# Patient Record
Sex: Female | Born: 1985 | Race: White | Hispanic: No | Marital: Single | Smoking: Former smoker
Health system: Southern US, Community
[De-identification: ages and names within clinical notes are randomized; demographics above are authoritative.]

## PROBLEM LIST (undated history)

## (undated) DIAGNOSIS — A599 Trichomoniasis, unspecified: Secondary | ICD-10-CM

## (undated) DIAGNOSIS — J45909 Unspecified asthma, uncomplicated: Secondary | ICD-10-CM

## (undated) DIAGNOSIS — R87629 Unspecified abnormal cytological findings in specimens from vagina: Secondary | ICD-10-CM

## (undated) DIAGNOSIS — F99 Mental disorder, not otherwise specified: Secondary | ICD-10-CM

## (undated) HISTORY — DX: Trichomoniasis, unspecified: A59.9

## (undated) HISTORY — DX: Mental disorder, not otherwise specified: F99

## (undated) HISTORY — PX: OOPHORECTOMY: SHX86

## (undated) HISTORY — DX: Unspecified abnormal cytological findings in specimens from vagina: R87.629

---

## 2011-11-20 HISTORY — PX: COLPOSCOPY: SHX161

## 2011-12-03 ENCOUNTER — Other Ambulatory Visit (HOSPITAL_COMMUNITY)
Admission: RE | Admit: 2011-12-03 | Discharge: 2011-12-03 | Disposition: A | Discharge status: Home / Self Care | Payer: Self-pay | Source: Ambulatory Visit | Attending: Unknown Physician Specialty | Admitting: Unknown Physician Specialty

## 2011-12-03 ENCOUNTER — Other Ambulatory Visit: Payer: Self-pay | Admitting: Nurse Practitioner

## 2011-12-03 DIAGNOSIS — R87612 Low grade squamous intraepithelial lesion on cytologic smear of cervix (LGSIL): Secondary | ICD-10-CM | POA: Insufficient documentation

## 2015-09-03 ENCOUNTER — Emergency Department (HOSPITAL_COMMUNITY)
Admission: EM | Admit: 2015-09-03 | Discharge: 2015-09-03 | Disposition: A | Discharge status: Home / Self Care | Payer: Self-pay | Attending: Emergency Medicine | Admitting: Emergency Medicine

## 2015-09-03 ENCOUNTER — Encounter (HOSPITAL_COMMUNITY): Payer: Self-pay | Admitting: Emergency Medicine

## 2015-09-03 ENCOUNTER — Emergency Department (HOSPITAL_COMMUNITY): Payer: Self-pay

## 2015-09-03 DIAGNOSIS — Z3202 Encounter for pregnancy test, result negative: Secondary | ICD-10-CM | POA: Insufficient documentation

## 2015-09-03 DIAGNOSIS — R102 Pelvic and perineal pain: Secondary | ICD-10-CM

## 2015-09-03 DIAGNOSIS — Z792 Long term (current) use of antibiotics: Secondary | ICD-10-CM | POA: Insufficient documentation

## 2015-09-03 DIAGNOSIS — F17210 Nicotine dependence, cigarettes, uncomplicated: Secondary | ICD-10-CM | POA: Insufficient documentation

## 2015-09-03 DIAGNOSIS — N898 Other specified noninflammatory disorders of vagina: Secondary | ICD-10-CM | POA: Insufficient documentation

## 2015-09-03 DIAGNOSIS — Z791 Long term (current) use of non-steroidal anti-inflammatories (NSAID): Secondary | ICD-10-CM | POA: Insufficient documentation

## 2015-09-03 DIAGNOSIS — N390 Urinary tract infection, site not specified: Secondary | ICD-10-CM | POA: Insufficient documentation

## 2015-09-03 LAB — URINALYSIS, ROUTINE W REFLEX MICROSCOPIC
BILIRUBIN URINE: NEGATIVE
GLUCOSE, UA: NEGATIVE mg/dL
KETONES UR: NEGATIVE mg/dL
LEUKOCYTES UA: NEGATIVE
Nitrite: NEGATIVE
PH: 6 (ref 5.0–8.0)
Protein, ur: NEGATIVE mg/dL
SPECIFIC GRAVITY, URINE: 1.01 (ref 1.005–1.030)

## 2015-09-03 LAB — URINE MICROSCOPIC-ADD ON: BACTERIA UA: NONE SEEN

## 2015-09-03 LAB — PREGNANCY, URINE: Preg Test, Ur: NEGATIVE

## 2015-09-03 MED ORDER — CEPHALEXIN 500 MG PO CAPS
500.0000 mg | ORAL_CAPSULE | Freq: Once | ORAL | Status: AC
  Administered 2015-09-03: 500 mg via ORAL
  Filled 2015-09-03: qty 1

## 2015-09-03 MED ORDER — CEPHALEXIN 250 MG PO CAPS
250.0000 mg | ORAL_CAPSULE | Freq: Four times a day (QID) | ORAL | Status: DC
Start: 2015-09-03 — End: 2016-12-31

## 2015-09-03 MED ORDER — IBUPROFEN 800 MG PO TABS
800.0000 mg | ORAL_TABLET | Freq: Once | ORAL | Status: AC
  Administered 2015-09-03: 800 mg via ORAL
  Filled 2015-09-03: qty 1

## 2015-09-03 NOTE — ED Provider Notes (Signed)
CSN: [id number]     Arrival date & time 09/03/15  1146 History   First MD Initiated Contact with Patient 09/03/15 1254     Chief Complaint  Patient presents with  . Abdominal Pain     (Consider location/radiation/quality/duration/timing/severity/associated sxs/prior Treatment) HPI  This is a 30 year old female who comes in today complaining of lower abdominal pain in the suprapubic to just left of center for at least a week. She states that she was seen at the health department on Tuesday. At that time she had a pelvic exam and says that she was given antibiotics for bacterial vaginosis. She has continued to have some vaginal discharge. She has normal menstrual cycles but this cycle has been a few days late. She states that she began having bleeding consistent with her menstrual cycle on the way here. She has a history of a left oophorectomy secondary to a very large cyst that she states twisted on itself. She describes pain as sharp and stabbing intermittently worse but not completely resolving. She denies fever, chills, nausea, vomiting or diarrhea. He is sexually active with a female partner. She denies any heterosexual contact. She does complain of some vaginal discharge. She denies any history of sexually transmitted infections.  History reviewed. No pertinent past medical history. Past Surgical History  Procedure Laterality Date  . Oophorectomy     History reviewed. No pertinent family history. Social History  Substance Use Topics  . Smoking status: Current Every Day Smoker -- 1.00 packs/day for 5 years    Types: Cigarettes  . Smokeless tobacco: Never Used  . Alcohol Use: No   OB History    No data available     Review of Systems  All other systems reviewed and are negative.     Allergies  Sulfa antibiotics  Home Medications   Prior to Admission medications   Medication Sig Start Date End Date Taking? Authorizing Provider  metroNIDAZOLE (FLAGYL) 500 MG tablet Take 500  mg by mouth 2 (two) times daily. Starting 08/29/2015 x 7 days.   Yes Historical Provider, MD  naproxen sodium (ALEVE) 220 MG tablet Take 220 mg by mouth 2 (two) times daily with a meal.   Yes Historical Provider, MD   BP 115/70 mmHg  Pulse 67  Temp(Src) 98.1 F (36.7 C) (Oral)  Resp 16  Ht 5\' 4"  (1.626 m)  Wt 63.504 kg  BMI 24.02 kg/m2  SpO2 100%  LMP 08/03/2015 Physical Exam  Constitutional: She is oriented to person, place, and time. She appears well-developed and well-nourished. No distress.  HENT:  Head: Normocephalic and atraumatic.  Right Ear: External ear normal.  Left Ear: External ear normal.  Nose: Nose normal.  Mouth/Throat: Oropharynx is clear and moist.  Eyes: Conjunctivae and EOM are normal. Pupils are equal, round, and reactive to light.  Neck: Normal range of motion. Neck supple.  Cardiovascular: Normal rate and regular rhythm.   Pulmonary/Chest: Effort normal and breath sounds normal.  Abdominal: Soft. Bowel sounds are normal. There is tenderness.  Mild suprapubic tenderness to palpation  Genitourinary: Vagina normal.  Vaginal discharge consistent with menstrual cycle. Cervical motion tenderness with some bulging tenderness  in the posterior fornix no lateral masses  Musculoskeletal: She exhibits no edema.  Neurological: She is alert and oriented to person, place, and time.  Skin: Skin is warm and dry.  Psychiatric: She has a normal mood and affect. Her behavior is normal.  Nursing note and vitals reviewed.   ED Course  Procedures (including  critical care time) Labs Review Labs Reviewed  URINALYSIS, ROUTINE W REFLEX MICROSCOPIC (NOT AT [facility]) - Abnormal; Notable for the following:    Hgb urine dipstick MODERATE (*)    All other components within normal limits  URINE MICROSCOPIC-ADD ON - Abnormal; Notable for the following:    Squamous Epithelial / LPF 6-30 (*)    All other components within normal limits  PREGNANCY, URINE  GC/CHLAMYDIA PROBE AMP ([facility]) NOT AT [facility]    Imaging Review US Transvaginal Non-ob  09/03/2015  CLINICAL DATA:  Mid left pelvic pain, prior left oophorectomy for torsion in 2010 EXAM: TRANSABDOMINAL AND TRANSVAGINAL ULTRASOUND OF PELVIS DOPPLER ULTRASOUND OF OVARIES TECHNIQUE: Both transabdominal and transvaginal ultrasound examinations of the pelvis were performed. Transabdominal technique was performed for global imaging of the pelvis including uterus, ovaries, adnexal regions, and pelvic cul-de-sac. It was necessary to proceed with endovaginal exam following the transabdominal exam to visualize the endometrium. Color and duplex Doppler ultrasound was utilized to evaluate blood flow to the ovaries. COMPARISON:  None. FINDINGS: Uterus Measurements: 7.2 x 2.3 x 4.7 cm. No fibroids or other mass visualized. Endometrium Thickness: 10 mm.  No focal abnormality visualized. Right ovary Measurements: 3.7 x 1.9 x 3.3 cm. Normal appearance/no adnexal mass. Left ovary Surgically absent. Pulsed Doppler evaluation of the right ovary demonstrates normal low-resistance arterial and venous waveforms. Other findings No abnormal free fluid. IMPRESSION: Status post left oophorectomy. No evidence of right ovarian torsion. Electronically Signed   By: Charline Bills M.D.   On: 09/03/2015 16:18   US Pelvis Complete  09/03/2015  CLINICAL DATA:  Mid left pelvic pain, prior left oophorectomy for torsion in 2010 EXAM: TRANSABDOMINAL AND TRANSVAGINAL ULTRASOUND OF PELVIS DOPPLER ULTRASOUND OF OVARIES TECHNIQUE: Both transabdominal and transvaginal ultrasound examinations of the pelvis were performed. Transabdominal technique was performed for global imaging of the pelvis including uterus, ovaries, adnexal regions, and pelvic cul-de-sac. It was necessary to proceed with endovaginal exam following the transabdominal exam to visualize the endometrium. Color and duplex Doppler ultrasound was utilized to evaluate blood flow to the ovaries. COMPARISON:   None. FINDINGS: Uterus Measurements: 7.2 x 2.3 x 4.7 cm. No fibroids or other mass visualized. Endometrium Thickness: 10 mm.  No focal abnormality visualized. Right ovary Measurements: 3.7 x 1.9 x 3.3 cm. Normal appearance/no adnexal mass. Left ovary Surgically absent. Pulsed Doppler evaluation of the right ovary demonstrates normal low-resistance arterial and venous waveforms. Other findings No abnormal free fluid. IMPRESSION: Status post left oophorectomy. No evidence of right ovarian torsion. Electronically Signed   By: Charline Bills M.D.   On: 09/03/2015 16:18   [place] Art/ven Flow Abd Pelv Doppler  09/03/2015  CLINICAL DATA:  Mid left pelvic pain, prior left oophorectomy for torsion in 2010 EXAM: TRANSABDOMINAL AND TRANSVAGINAL ULTRASOUND OF PELVIS DOPPLER ULTRASOUND OF OVARIES TECHNIQUE: Both transabdominal and transvaginal ultrasound examinations of the pelvis were performed. Transabdominal technique was performed for global imaging of the pelvis including uterus, ovaries, adnexal regions, and pelvic cul-de-sac. It was necessary to proceed with endovaginal exam following the transabdominal exam to visualize the endometrium. Color and duplex Doppler ultrasound was utilized to evaluate blood flow to the ovaries. COMPARISON:  None. FINDINGS: Uterus Measurements: 7.2 x 2.3 x 4.7 cm. No fibroids or other mass visualized. Endometrium Thickness: 10 mm.  No focal abnormality visualized. Right ovary Measurements: 3.7 x 1.9 x 3.3 cm. Normal appearance/no adnexal mass. Left ovary Surgically absent. Pulsed Doppler evaluation of the right ovary demonstrates normal low-resistance arterial and  venous waveforms. Other findings No abnormal free fluid. IMPRESSION: Status post left oophorectomy. No evidence of right ovarian torsion. Electronically Signed   By: Charline Bills M.D.   On: 09/03/2015 16:18   I have personally reviewed and evaluated these images and lab results as part of my medical decision-making.   EKG  Interpretation None      MDM   Final diagnoses:  Pelvic pain in female   30 year old female who reports no prior pregnancies comes in today with pelvic pain and some vaginal discharge. She reports that she'll contact only with female partners. This decreases her facility of some sexually transmitted infections. She has recently been treated for bacterial vaginosis and no repeat prep was done. Patient had cultures sent for gonorrhea and chlamydia. She does have tenderness to palpation and is having an ultrasound done given her history of ovarian torsion with a left oophorectomy.    Margarita Grizzle, MD 09/05/15 [id number]

## 2015-09-03 NOTE — ED Notes (Addendum)
Patient c/o left lower abd pain. Per patient pain "for a while and has went to health department but they are unable to do ultrasound." Patient states she has had pain in left lower abd since having left ovary removed in 2013. Per patient period is a few days late, denies any possibility of pregnancy. Denies any nausea, vomiting, or diarrhea. Patient reports sharp pain in lower abd becoming more severe this morning. Patient taking antibiotic for BV.

## 2015-09-03 NOTE — Discharge Instructions (Signed)

## 2015-09-04 LAB — GC/CHLAMYDIA PROBE AMP (~~LOC~~) NOT AT ARMC
CHLAMYDIA, DNA PROBE: NEGATIVE
Neisseria Gonorrhea: NEGATIVE

## 2016-04-12 ENCOUNTER — Emergency Department (HOSPITAL_COMMUNITY): Payer: Self-pay

## 2016-04-12 ENCOUNTER — Emergency Department (HOSPITAL_COMMUNITY)
Admission: EM | Admit: 2016-04-12 | Discharge: 2016-04-12 | Disposition: A | Discharge status: Home / Self Care | Payer: Self-pay | Attending: Emergency Medicine | Admitting: Emergency Medicine

## 2016-04-12 ENCOUNTER — Other Ambulatory Visit: Payer: Self-pay

## 2016-04-12 ENCOUNTER — Encounter (HOSPITAL_COMMUNITY): Payer: Self-pay | Admitting: *Deleted

## 2016-04-12 DIAGNOSIS — Z79899 Other long term (current) drug therapy: Secondary | ICD-10-CM | POA: Insufficient documentation

## 2016-04-12 DIAGNOSIS — F17210 Nicotine dependence, cigarettes, uncomplicated: Secondary | ICD-10-CM | POA: Insufficient documentation

## 2016-04-12 DIAGNOSIS — J208 Acute bronchitis due to other specified organisms: Secondary | ICD-10-CM | POA: Insufficient documentation

## 2016-04-12 DIAGNOSIS — J45909 Unspecified asthma, uncomplicated: Secondary | ICD-10-CM | POA: Insufficient documentation

## 2016-04-12 HISTORY — DX: Unspecified asthma, uncomplicated: J45.909

## 2016-04-12 LAB — COMPREHENSIVE METABOLIC PANEL
ALBUMIN: 4.3 g/dL (ref 3.5–5.0)
ALK PHOS: 51 U/L (ref 38–126)
ALT: 21 U/L (ref 14–54)
AST: 20 U/L (ref 15–41)
Anion gap: 5 (ref 5–15)
BILIRUBIN TOTAL: 0.6 mg/dL (ref 0.3–1.2)
BUN: 14 mg/dL (ref 6–20)
CALCIUM: 9 mg/dL (ref 8.9–10.3)
CO2: 28 mmol/L (ref 22–32)
CREATININE: 0.73 mg/dL (ref 0.44–1.00)
Chloride: 105 mmol/L (ref 101–111)
Glucose, Bld: 88 mg/dL (ref 65–99)
Potassium: 4.4 mmol/L (ref 3.5–5.1)
SODIUM: 138 mmol/L (ref 135–145)
TOTAL PROTEIN: 7.2 g/dL (ref 6.5–8.1)

## 2016-04-12 LAB — POC URINE PREG, ED: PREG TEST UR: NEGATIVE

## 2016-04-12 LAB — LIPASE, BLOOD: Lipase: 22 U/L (ref 11–51)

## 2016-04-12 MED ORDER — BENZONATATE 100 MG PO CAPS: 100.0000 mg | ORAL_CAPSULE | Freq: Three times a day (TID) | ORAL | 0 refills | Status: DC | PRN

## 2016-04-12 MED ORDER — LORATADINE-PSEUDOEPHEDRINE ER 5-120 MG PO TB12: 1.0000 | ORAL_TABLET | Freq: Two times a day (BID) | ORAL | 0 refills | Status: DC

## 2016-04-12 MED ORDER — PREDNISONE 20 MG PO TABS
40.0000 mg | ORAL_TABLET | Freq: Every day | ORAL | 0 refills | Status: DC
Start: 2016-04-12 — End: 2016-12-31

## 2016-04-12 MED ORDER — DEXAMETHASONE 4 MG PO TABS: 4.0000 mg | ORAL_TABLET | Freq: Two times a day (BID) | ORAL | 0 refills | Status: DC

## 2016-04-12 MED ORDER — PROMETHAZINE-CODEINE 6.25-10 MG/5ML PO SYRP: ORAL_SOLUTION | ORAL | 0 refills | Status: DC

## 2016-04-12 NOTE — Discharge Instructions (Signed)
Please increase fluids. Please use Tylenol every 4 hours or ibuprofen every 6 hours for fever or aching. Please wash hands frequently. Use Decadron 2 times daily with food. Use promethazine cough medication every 4 hours as needed for cough and congestion. This medication may cause drowsiness, please use with caution. Please see your primary physician or return to the emergency department if not improving.

## 2016-04-12 NOTE — ED Provider Notes (Signed)
[facility]EMER[facility]Y DEPT Provider Note   CSN: [id number] Arrival date & time: 04/12/16  1508     History   Chief Complaint Chief Complaint  Patient presents with  . Cough    HPI Jacqueline Forbes is a 30 y.o. female.  Patient is a 30 year old female who presents to the emergency department with a complaint of cough and congestion.  The patient states she's had 3 days of cough and congestion. She presents now because she is now having pressure sensation in her chest that seems to come and go. This actually worsens with cough and movement. She states however there are times when it seems to worsen with certain foods. She is concerned because she has a strong family history of gallbladder disease. She has not had any fever or chills. There's been no hemoptysis reported. There's been no injury to the chest. There's been no recent operations or procedures. The patient denies any recent long trips, or long bouts of inactivity. She's never been diagnosed with deep vein thrombosis or pulmonary embolus in the past. Coughing seems to make the problem worse. Nothing seems to really make it any better.      Past Medical History:  Diagnosis Date  . Asthma     There are no active problems to display for this patient.   Past Surgical History:  Procedure Laterality Date  . OOPHORECTOMY      OB History    No data available       Home Medications    Prior to Admission medications   Medication Sig Start Date End Date Taking? Authorizing Provider  cephALEXin (KEFLEX) 250 MG capsule Take 1 capsule (250 mg total) by mouth 4 (four) times daily. 09/03/15   Margarita Grizzleanielle Ray, MD  metroNIDAZOLE (FLAGYL) 500 MG tablet Take 500 mg by mouth 2 (two) times daily. Starting 08/29/2015 x 7 days.    Historical Provider, MD  naproxen sodium (ALEVE) 220 MG tablet Take 220 mg by mouth 2 (two) times daily with a meal.    Historical Provider, MD    Family History No family history on file.  Social  History Social History  Substance Use Topics  . Smoking status: Current Every Day Smoker    Packs/day: 1.00    Years: 5.00    Types: Cigarettes  . Smokeless tobacco: Never Used  . Alcohol use No     Allergies   Sulfa antibiotics   Review of Systems Review of Systems  Constitutional: Positive for appetite change and chills. Negative for fever.  Respiratory: Positive for cough and chest tightness.   Cardiovascular: Negative for leg swelling.  All other systems reviewed and are negative.    Physical Exam Updated Vital Signs BP 114/64 (BP Location: Left Arm)   Pulse 72   Temp 98.1 F (36.7 C) (Oral)   Resp 16   Ht 5\' 4"  (1.626 m)   Wt 63.5 kg   LMP 04/07/2016   SpO2 100%   BMI 24.03 kg/m   Physical Exam  Constitutional: She is oriented to person, place, and time. She appears well-developed and well-nourished.  Non-toxic appearance.  HENT:  Head: Normocephalic.  Right Ear: Tympanic membrane and external ear normal.  Left Ear: Tympanic membrane and external ear normal.  Eyes: EOM and lids are normal. Pupils are equal, round, and reactive to light.  Neck: Normal range of motion. Neck supple. Carotid bruit is not present.  Cardiovascular: Normal rate, regular rhythm, normal heart sounds, intact distal pulses and normal pulses.  Pulmonary/Chest: Breath sounds normal. No respiratory distress. She has no wheezes.  Course breath sounds. There is symmetrical rise and fall of the chest. Patient speaks in complete sentences without problem.  Abdominal: Soft. Bowel sounds are normal. She exhibits no distension and no mass. There is no tenderness. There is no guarding.  Musculoskeletal: Normal range of motion.  Negative Homans sign. No swelling or redness of the lower extremities.  Lymphadenopathy:       Head (right side): No submandibular adenopathy present.       Head (left side): No submandibular adenopathy present.    She has no cervical adenopathy.  Neurological: She is  alert and oriented to person, place, and time. She has normal strength. No cranial nerve deficit or sensory deficit.  Skin: Skin is warm and dry.  Psychiatric: She has a normal mood and affect. Her speech is normal.  Nursing note and vitals reviewed.    ED Treatments / Results  Labs (all labs ordered are listed, but only abnormal results are displayed) Labs Reviewed  LIPASE, BLOOD  COMPREHENSIVE METABOLIC PANEL  POC URINE PREG, ED    EKG  EKG Interpretation None       Radiology Dg Chest 2 View  Result Date: 04/12/2016 CLINICAL DATA:  Central chest pain and productive cough for 2-3 days. EXAM: CHEST  2 VIEW COMPARISON:  None. FINDINGS: The heart size and mediastinal contours are within normal limits. Both lungs are clear. The visualized skeletal structures are unremarkable. IMPRESSION: Normal chest x-ray Electronically Signed   By: Rudie Meyer M.D.   On: 04/12/2016 16:47    Procedures Procedures (including critical care time)  Medications Ordered in ED Medications - No data to display   Initial Impression / Assessment and Plan / ED Course  I have reviewed the triage vital signs and the nursing notes.  Pertinent labs & imaging results that were available during my care of the patient were reviewed by me and considered in my medical decision making (see chart for details).  Clinical Course    **I have reviewed nursing notes, vital signs, and all appropriate lab and imaging results for this patient.*  Final Clinical Impressions(s) / ED Diagnoses  Pulse oximetry is 100% on room air. Within normal limits by my interpretation. Chest x-ray is negative for acute findings.  Suspect bronchitis. Patient will be treated with Decadron and promethazine codeine cough medication. I've asked the patient increase fluids, and to wash hands frequently. She is to return to the emergency department if any unusual changes, problems, or concerns.    Final diagnoses:  None    New  Prescriptions New Prescriptions   No medications on file     Ivery Quale, PA-C 04/12/16 1754    Bethann Berkshire, MD 04/15/16 2005

## 2016-04-12 NOTE — ED Triage Notes (Signed)
Pt comes in with cough and congestion starting 3 days ago. Pt states she has a pressure chest that comes and goes, and that is worsens with cough and movement.

## 2016-12-31 ENCOUNTER — Encounter: Payer: Self-pay | Admitting: *Deleted

## 2017-01-07 ENCOUNTER — Encounter: Payer: Self-pay | Admitting: Obstetrics & Gynecology

## 2017-01-27 ENCOUNTER — Ambulatory Visit (INDEPENDENT_AMBULATORY_CARE_PROVIDER_SITE_OTHER): Payer: Self-pay | Admitting: Obstetrics & Gynecology

## 2017-01-27 ENCOUNTER — Encounter: Payer: Self-pay | Admitting: Obstetrics & Gynecology

## 2017-01-27 VITALS — BP 90/60 | HR 76 | Wt 155.0 lb

## 2017-01-27 DIAGNOSIS — N841 Polyp of cervix uteri: Secondary | ICD-10-CM

## 2017-01-27 DIAGNOSIS — N920 Excessive and frequent menstruation with regular cycle: Secondary | ICD-10-CM

## 2017-01-27 DIAGNOSIS — N944 Primary dysmenorrhea: Secondary | ICD-10-CM

## 2017-01-27 MED ORDER — NAPROXEN SODIUM 550 MG PO TABS: 550.0000 mg | ORAL_TABLET | Freq: Two times a day (BID) | ORAL | 1 refills | Status: DC

## 2017-01-27 NOTE — Progress Notes (Signed)
Chief Complaint  Patient presents with  . Referral    cervical polyp    Blood pressure 90/60, pulse 76, weight 155 lb (70.3 kg), last menstrual period 01/01/2017.  31 y.o. G0P0000 Patient's last menstrual period was 01/01/2017. The current method of family planning is none.  Outpatient Encounter Prescriptions as of 01/27/2017  Medication Sig  . loratadine (CLARITIN) 10 MG tablet Take 10 mg by mouth daily.  . naproxen sodium (ANAPROX DS) 550 MG tablet Take 1 tablet (550 mg total) by mouth 2 (two) times daily with a meal.  . [DISCONTINUED] clindamycin (CLEOCIN) 300 MG capsule Take 300 mg by mouth 2 (two) times daily.  . [DISCONTINUED] fluconazole (DIFLUCAN) 150 MG tablet Take 150 mg by mouth daily.   No facility-administered encounter medications on file as of 01/27/2017.     Subjective Patient is referral in today [facility] [place] [facility] for evaluation of a lesion of the cervix felt to be a cervical polyp She has had some postcoital bleeding As a history of a left salpingo-oophorectomy because of the large cyst She has fairly regular periods they are quite painful and heavy always been her baseline, today she can hardly move she is in the fetal position, she is on a birth control pills because she smokes and she does not have a stroke So she is trying to stop smoking which I definitely encourage She also states she's had some recurrent been treated at least for recurrent BV and yeast infections over the past couple years and I told hard for me to really comment on that unless I see her and actually documented, vaginitis she's having But I did refer her to using [employer] femdophilus and that should diminish her episode significantly Objective General WDWN female NAD Vulva:  normal appearing vulva with no masses, tenderness or lesions Vagina:  normal mucosa, no discharge Cervix:  Endocervical polyp is present and removed without difficulty with a ring  forcep, minimal bleeding after, it isn't the pathology for evaluation Uterus:  normal size, contour, position, consistency, mobility, non-tender Adnexa: ovaries:,     Pertinent ROS No burning with urination, frequency or urgency No nausea, vomiting or diarrhea Nor fever chills or other constitutional symptoms   Labs or studies none2    Impression Diagnoses this Encounter::   ICD-10-CM   1. Endocervical polyp N84.1    Removed without difficulty and the office today  2. Menorrhagia with regular cycle N92.0   3. Primary dysmenorrhea N94.4     Established relevant diagnosis(es):   Plan/Recommendations: Meds ordered this encounter  Medications  . naproxen sodium (ANAPROX DS) 550 MG tablet    Sig: Take 1 tablet (550 mg total) by mouth 2 (two) times daily with a meal.    Dispense:  60 tablet    Refill:  1    Labs or Scans Ordered: No orders of the defined types were placed in this encounter.   Management:: Endocervical polyp was removed the day She is encouraged to gets femdophilus probiotic for vaginal stability of the micro-environment She is given a prescription for Anaprox DS and encouraged using pads for dysmenorrhea but also consider getting on something for cycle control like an oral contraceptive pill  Follow up Return if symptoms worsen or fail to improve, for Follow up, with Dr Despina HiddenEure.        Face to face time:  20 minutes  Greater than 50% of the visit time was spent in counseling and  coordination of care with the patient.  The summary and outline of the counseling and care coordination is summarized in the note above.   All questions were answered.  Past Medical History:  Diagnosis Date  . Asthma   . Trichomonas infection   . Vaginal Pap smear, abnormal    2013 LSIL    Past Surgical History:  Procedure Laterality Date  . COLPOSCOPY  11/2011  . OOPHORECTOMY      OB History    Gravida Para Term Preterm AB Living   0 0 0 0 0 0   SAB TAB  Ectopic Multiple Live Births   0 0 0 0 0      Allergies  Allergen Reactions  . Metronidazole   . Septra [Sulfamethoxazole-Trimethoprim]   . Sulfa Antibiotics Hives and Nausea And Vomiting  . Tylenol With Codeine #3 [Acetaminophen-Codeine]     Social History   Social History  . Marital status: Single    Spouse name: N/A  . Number of children: N/A  . Years of education: N/A   Social History Main Topics  . Smoking status: Current Every Day Smoker    Packs/day: 0.50    Years: 5.00    Types: Cigarettes  . Smokeless tobacco: Never Used  . Alcohol use No  . Drug use: No  . Sexual activity: Yes   Other Topics Concern  . None   Social History Narrative  . None    Family History  Problem Relation Age of Onset  . Arthritis Father   . Depression Father   . Arthritis Mother   . Cancer Mother        breast

## 2017-01-27 NOTE — Addendum Note (Signed)
Addended by: Federico FlakeNES, Ambar Raphael A on: 01/27/2017 04:39 PM   Modules accepted: Orders

## 2017-01-30 ENCOUNTER — Telehealth: Payer: Self-pay | Admitting: Obstetrics & Gynecology

## 2017-01-30 NOTE — Telephone Encounter (Signed)
Patient had questions regarding bleeding after having polyp removed. Stated she has just had spotting until today but thinks she could be starting her period. Informed patient it could last for a week but to monitor. If she was saturating more than 1-2 pads an hour, then she needed to call us or go to the ER. Verbalized understanding.

## 2017-01-30 NOTE — Telephone Encounter (Signed)
Pt called stating that she would like to speak with Dr. Despina HiddenEure or his nurse. Pt did not specify the reason why. Please contact pt

## 2017-04-28 ENCOUNTER — Encounter: Payer: Self-pay | Admitting: Obstetrics & Gynecology

## 2017-05-13 ENCOUNTER — Encounter: Payer: Self-pay | Admitting: Obstetrics & Gynecology

## 2017-05-13 ENCOUNTER — Encounter (INDEPENDENT_AMBULATORY_CARE_PROVIDER_SITE_OTHER): Payer: Self-pay

## 2017-05-13 ENCOUNTER — Ambulatory Visit (INDEPENDENT_AMBULATORY_CARE_PROVIDER_SITE_OTHER): Payer: Self-pay | Admitting: Obstetrics & Gynecology

## 2017-05-13 VITALS — BP 100/56 | HR 79 | Ht 64.0 in | Wt 143.0 lb

## 2017-05-13 DIAGNOSIS — B9689 Other specified bacterial agents as the cause of diseases classified elsewhere: Secondary | ICD-10-CM

## 2017-05-13 DIAGNOSIS — Z1389 Encounter for screening for other disorder: Secondary | ICD-10-CM

## 2017-05-13 DIAGNOSIS — N760 Acute vaginitis: Secondary | ICD-10-CM

## 2017-05-13 LAB — POCT URINALYSIS DIPSTICK
Glucose, UA: NEGATIVE
KETONES UA: NEGATIVE
Leukocytes, UA: NEGATIVE
Nitrite, UA: NEGATIVE
PROTEIN UA: NEGATIVE
RBC UA: NEGATIVE

## 2017-05-13 MED ORDER — CLINDAMYCIN HCL 300 MG PO CAPS: 300.0000 mg | ORAL_CAPSULE | Freq: Two times a day (BID) | ORAL | 0 refills | Status: DC

## 2017-05-13 NOTE — Progress Notes (Signed)
Chief Complaint  Patient presents with  . Vaginal Discharge    Blood pressure (!) 100/56, pulse 79, height 5\' 4"  (1.626 m), weight 143 lb (64.9 kg).  31 y.o. G0P0000 No LMP recorded. The current method of family planning is none.  Subjective Vaginal discharge for 2weeks Itching yes Irritation yes/painful Odor yes Similar to previous yes Associated shooting pain Modifying factors: no antibiotics  Previous treatment never gel  Past Medical History:  Diagnosis Date  . Asthma   . Trichomonas infection   . Vaginal Pap smear, abnormal    2013 LSIL    Past Surgical History:  Procedure Laterality Date  . COLPOSCOPY  11/2011  . OOPHORECTOMY      OB History    Gravida Para Term Preterm AB Living   0 0 0 0 0 0   SAB TAB Ectopic Multiple Live Births   0 0 0 0 0      Allergies  Allergen Reactions  . Metronidazole   . Septra [Sulfamethoxazole-Trimethoprim]   . Sulfa Antibiotics Hives and Nausea And Vomiting  . Tylenol With Codeine #3 [Acetaminophen-Codeine]     Social History   Social History  . Marital status: Single    Spouse name: N/A  . Number of children: N/A  . Years of education: N/A   Social History Main Topics  . Smoking status: Current Every Day Smoker    Packs/day: 0.50    Years: 5.00    Types: Cigarettes  . Smokeless tobacco: Never Used  . Alcohol use No  . Drug use: No  . Sexual activity: Yes   Other Topics Concern  . None   Social History Narrative  . None    Family History  Problem Relation Age of Onset  . Arthritis Father   . Depression Father   . Arthritis Mother   . Cancer Mother        breast     Objective Vulva:  normal appearing vulva with no masses, tenderness or lesions Vagina:  normal mucosa, no  lesions, thick grey discharge Cervix:  no cervical motion tenderness, no lesions and no polyp Uterus:  normal size, contour, position, consistency, mobility, non-tender Adnexa: ovaries:present,  normal adnexa in  size, nontender and no masses     Pertinent ROS No burning with urination, frequency or urgency No nausea, vomiting or diarrhea Nor fever chills or other constitutional symptoms   Labs or studies Wet Prep:   A sample of vaginal discharge was obtained from the posterior fornix using a cotton swab. 2 drops of saline were placed on a slide and the cotton swab was immersed in the saline. Microscopic evaluation was performed and results were as follows:  Negative  for yeast  Positive for clue cells , consistent with Bacterial vaginosis Negative for trichomonas  Normal WBC population   Whiff test: Positive     Impression Diagnoses this Encounter::   ICD-10-CM   1. BV (bacterial vaginosis) N76.0    B96.89   2. Screening for genitourinary condition Z13.89 POCT Urinalysis Dipstick    Established relevant diagnosis(es):   Plan/Recommendations: Meds ordered this encounter  Medications  . clindamycin (CLEOCIN) 300 MG capsule    Sig: Take 1 capsule (300 mg total) by mouth 2 (two) times daily.    Dispense:  14 capsule    Refill:  0    Labs or Scans Ordered: Orders Placed This Encounter  Procedures  . POCT Urinalysis Dipstick    Management:: Patient  has a significant allergic reaction to metronidazole orally and I am reticent to use vaginal metronidazole as a result She is self pay so the Clindesse vaginal and the new A Zullo derivative is also not an option because of cost As a result underwent a use Cleocin 300 mg 1 twice a day for 7 days and follow Lanora Manislizabeth back up in 1 week to see if we were able to eradicate the bacterial vaginosis  Follow up Return in about 2 weeks (around 05/27/2017) for Follow up, with Dr Despina HiddenEure.     All questions were answered.

## 2017-05-20 IMAGING — US US TRANSVAGINAL NON-OB
1 series · 14 of 25 positions shown · non-contrast
Comparison: None.

CLINICAL DATA: Mid left pelvic pain, prior left oophorectomy for
torsion in 0585

EXAM:
TRANSABDOMINAL AND TRANSVAGINAL ULTRASOUND OF PELVIS
DOPPLER ULTRASOUND OF OVARIES
TECHNIQUE: Both transabdominal and transvaginal ultrasound examinations of the
pelvis were performed. Transabdominal technique was performed for
global imaging of the pelvis including uterus, ovaries, adnexal
regions, and pelvic cul-de-sac.
It was necessary to proceed with endovaginal exam following the
transabdominal exam to visualize the endometrium. Color and duplex
Doppler ultrasound was utilized to evaluate blood flow to the
ovaries.

[Series 1: us transvaginal non-ob · 0.19mm/px · 14 of 147 slices shown]
[im 1/147]
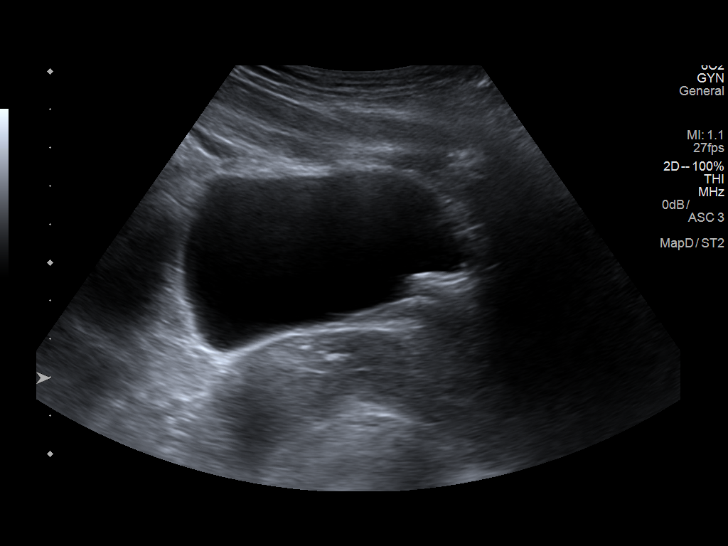
[im 13/147]
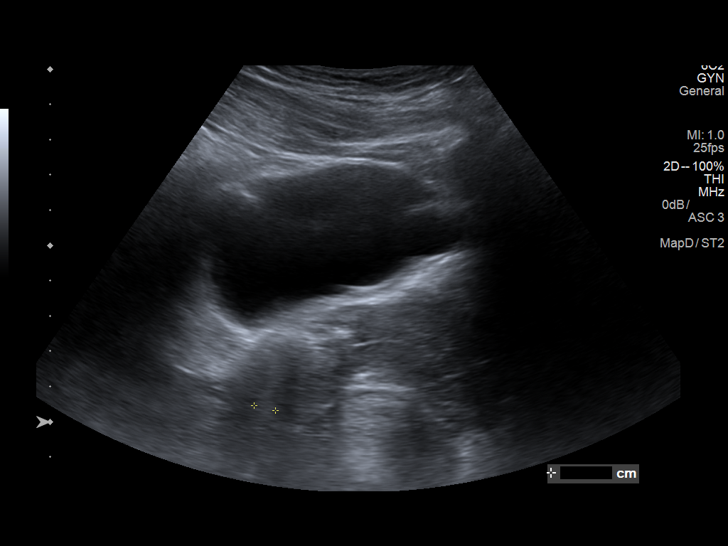
[im 25/147]
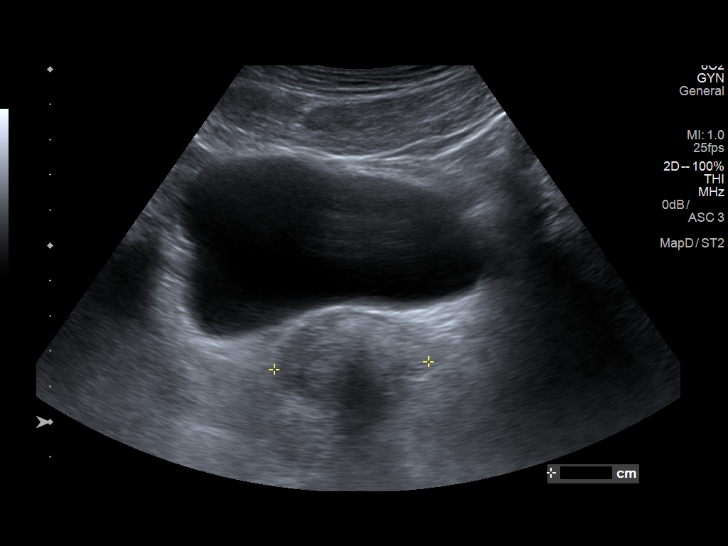
[im 37/147]
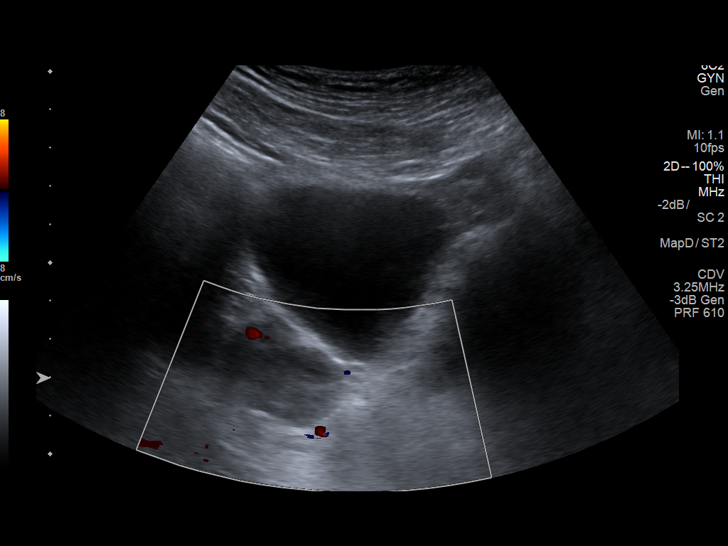
[im 49/147]
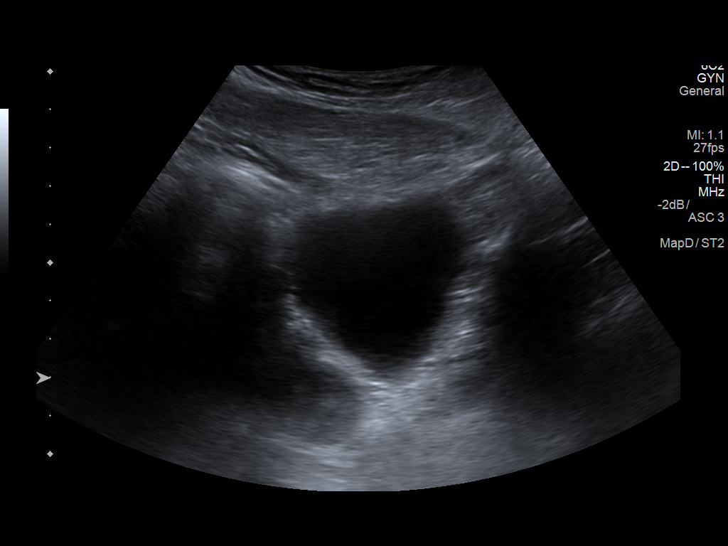
[im 55/147]
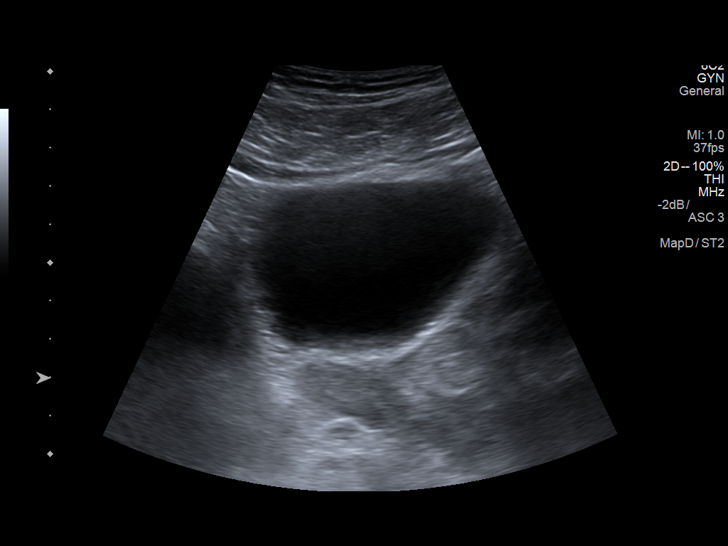
[im 67/147]
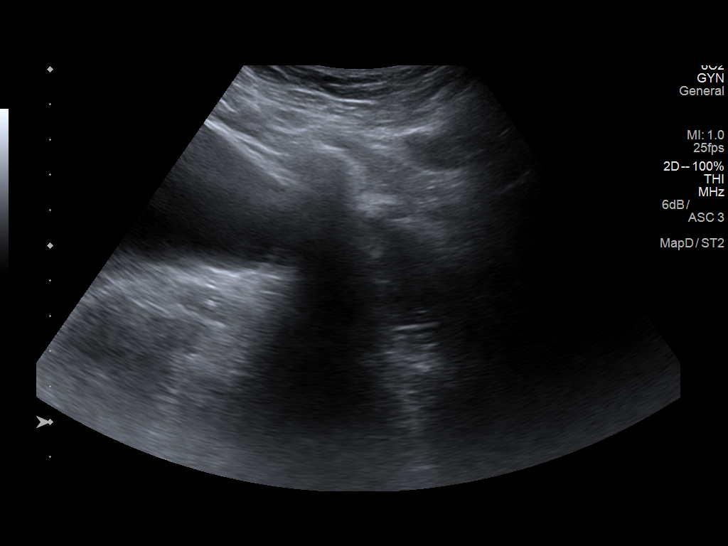
[im 80/147]
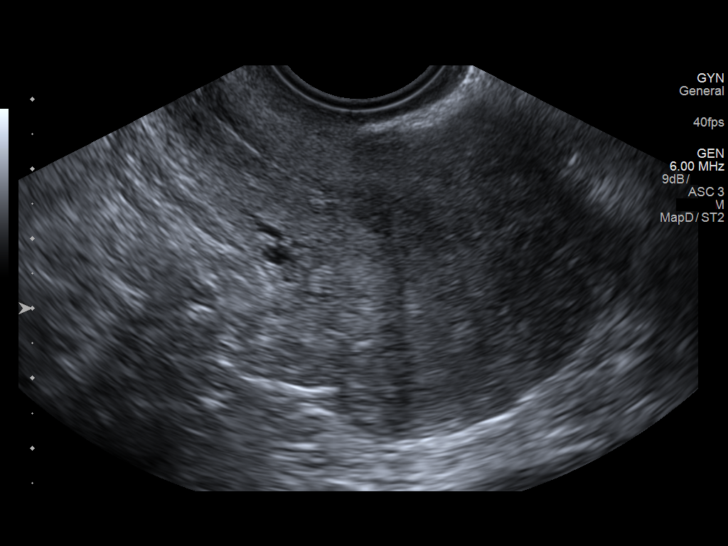
[im 92/147]
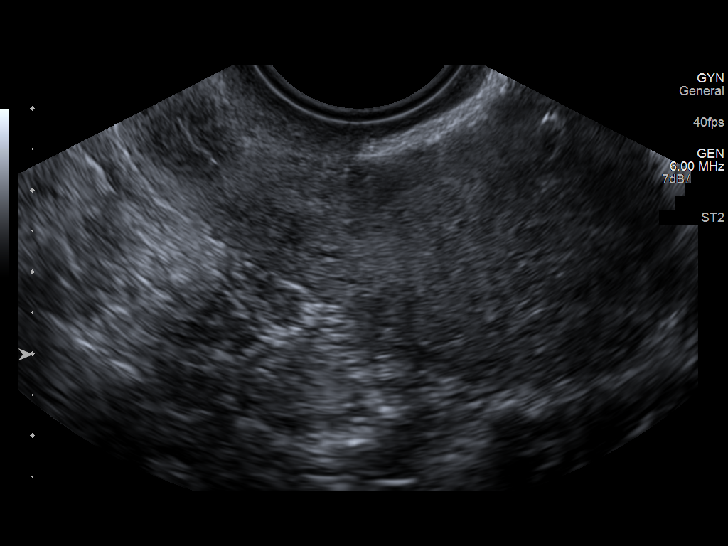
[im 98/147]
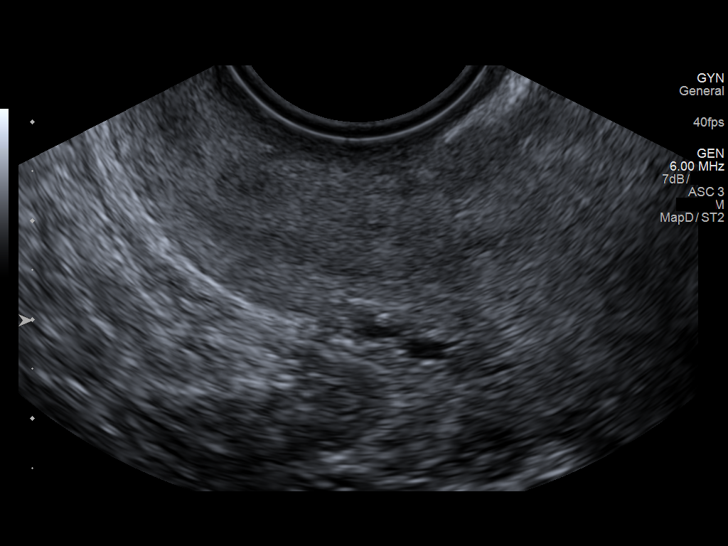
[im 110/147]
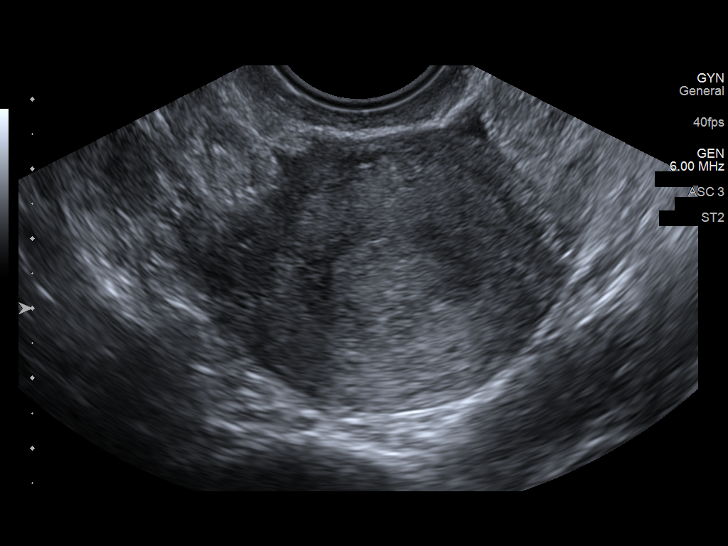
[im 122/147]
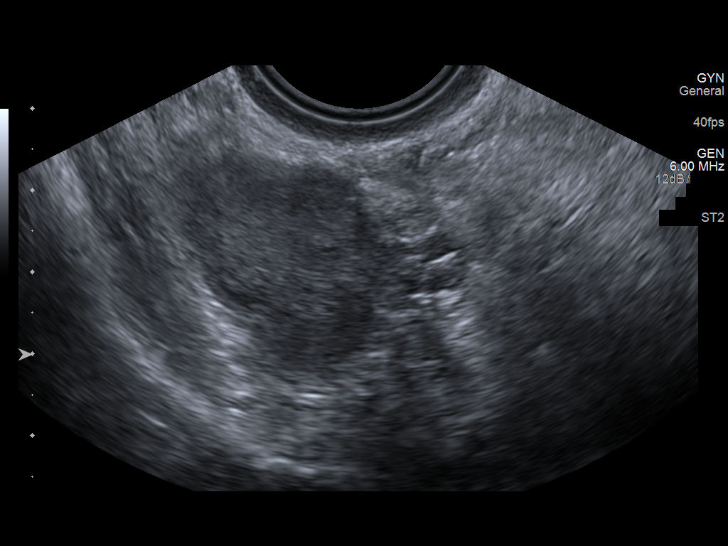
[im 134/147]
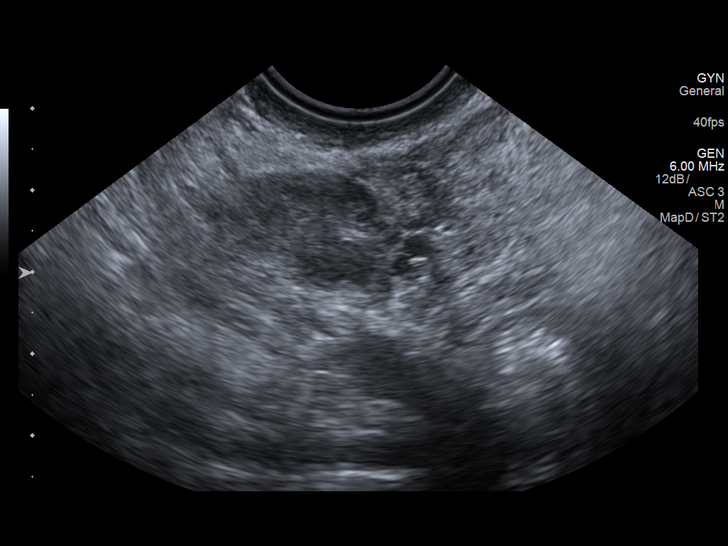
[im 147/147]
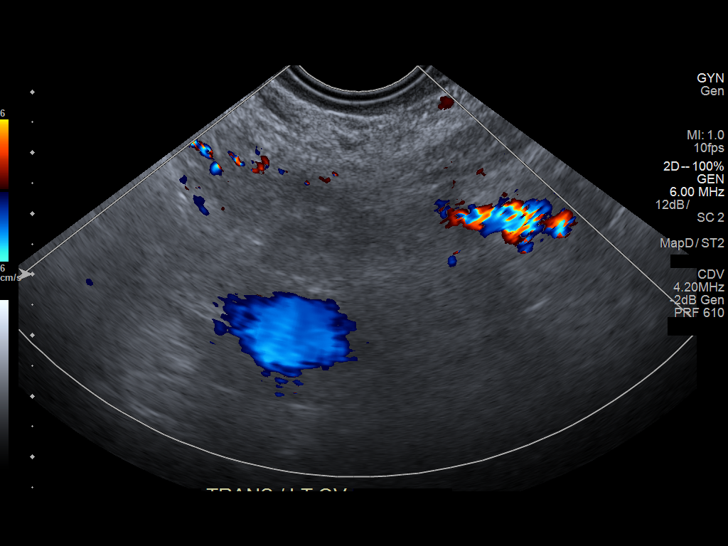

[14 of 25 positions shown; findings below may reference images not displayed]

FINDINGS: Uterus

Measurements: 7.2 x 2.3 x 4.7 cm. No fibroids or other mass
visualized.

Endometrium

Thickness: 10 mm.  No focal abnormality visualized.

Right ovary

Measurements: 3.7 x 1.9 x 3.3 cm. Normal appearance/no adnexal mass.

Left ovary

Surgically absent.

Pulsed Doppler evaluation of the right ovary demonstrates normal
low-resistance arterial and venous waveforms.

Other findings

No abnormal free fluid.
IMPRESSION: Status post left oophorectomy.

No evidence of right ovarian torsion.

## 2017-05-26 ENCOUNTER — Telehealth: Payer: Self-pay | Admitting: *Deleted

## 2017-05-26 NOTE — Telephone Encounter (Signed)
Patient called stating she started her cycle 2 days ago and would like to reschedule her appointment. Will change to next Wednesday.

## 2017-05-27 ENCOUNTER — Ambulatory Visit: Payer: Self-pay | Admitting: Obstetrics & Gynecology

## 2017-05-31 ENCOUNTER — Emergency Department (HOSPITAL_COMMUNITY)
Admission: EM | Admit: 2017-05-31 | Discharge: 2017-05-31 | Disposition: A | Discharge status: Home / Self Care | Payer: Self-pay | Attending: Emergency Medicine | Admitting: Emergency Medicine

## 2017-05-31 ENCOUNTER — Encounter (HOSPITAL_COMMUNITY): Payer: Self-pay | Admitting: Emergency Medicine

## 2017-05-31 ENCOUNTER — Other Ambulatory Visit: Payer: Self-pay

## 2017-05-31 DIAGNOSIS — J029 Acute pharyngitis, unspecified: Secondary | ICD-10-CM | POA: Insufficient documentation

## 2017-05-31 DIAGNOSIS — R05 Cough: Secondary | ICD-10-CM | POA: Insufficient documentation

## 2017-05-31 DIAGNOSIS — J45909 Unspecified asthma, uncomplicated: Secondary | ICD-10-CM | POA: Insufficient documentation

## 2017-05-31 DIAGNOSIS — R509 Fever, unspecified: Secondary | ICD-10-CM | POA: Insufficient documentation

## 2017-05-31 DIAGNOSIS — R059 Cough, unspecified: Secondary | ICD-10-CM

## 2017-05-31 DIAGNOSIS — R0602 Shortness of breath: Secondary | ICD-10-CM | POA: Insufficient documentation

## 2017-05-31 DIAGNOSIS — R21 Rash and other nonspecific skin eruption: Secondary | ICD-10-CM | POA: Insufficient documentation

## 2017-05-31 DIAGNOSIS — F17210 Nicotine dependence, cigarettes, uncomplicated: Secondary | ICD-10-CM | POA: Insufficient documentation

## 2017-05-31 LAB — CBC WITH DIFFERENTIAL/PLATELET
BASOS PCT: 1 %
Basophils Absolute: 0 10*3/uL (ref 0.0–0.1)
EOS ABS: 0.5 10*3/uL (ref 0.0–0.7)
EOS PCT: 8 %
HCT: 39.9 % (ref 36.0–46.0)
HEMOGLOBIN: 13.5 g/dL (ref 12.0–15.0)
Lymphocytes Relative: 25 %
Lymphs Abs: 1.4 10*3/uL (ref 0.7–4.0)
MCH: 34.5 pg — AB (ref 26.0–34.0)
MCHC: 33.8 g/dL (ref 30.0–36.0)
MCV: 102 fL — ABNORMAL HIGH (ref 78.0–100.0)
MONOS PCT: 7 %
Monocytes Absolute: 0.4 10*3/uL (ref 0.1–1.0)
NEUTROS PCT: 59 %
Neutro Abs: 3.3 10*3/uL (ref 1.7–7.7)
PLATELETS: 214 10*3/uL (ref 150–400)
RBC: 3.91 MIL/uL (ref 3.87–5.11)
RDW: 12.5 % (ref 11.5–15.5)
WBC: 5.5 10*3/uL (ref 4.0–10.5)

## 2017-05-31 LAB — BASIC METABOLIC PANEL
Anion gap: 7 (ref 5–15)
BUN: 12 mg/dL (ref 6–20)
CALCIUM: 9 mg/dL (ref 8.9–10.3)
CO2: 25 mmol/L (ref 22–32)
CREATININE: 0.67 mg/dL (ref 0.44–1.00)
Chloride: 104 mmol/L (ref 101–111)
GFR calc Af Amer: >60 mL/min (ref >60)
GFR calc non Af Amer: >60 mL/min (ref >60)
Glucose, Bld: 87 mg/dL (ref 65–99)
Potassium: 3.7 mmol/L (ref 3.5–5.1)
SODIUM: 136 mmol/L (ref 135–145)

## 2017-05-31 LAB — PREGNANCY, URINE: PREG TEST UR: NEGATIVE

## 2017-05-31 LAB — RAPID STREP SCREEN (MED CTR MEBANE ONLY): STREPTOCOCCUS, GROUP A SCREEN (DIRECT): NEGATIVE

## 2017-05-31 MED ORDER — BENZONATATE 100 MG PO CAPS: 100.0000 mg | ORAL_CAPSULE | Freq: Three times a day (TID) | ORAL | 0 refills | Status: DC

## 2017-05-31 MED ORDER — PREDNISONE 20 MG PO TABS
20.0000 mg | ORAL_TABLET | Freq: Once | ORAL | Status: AC
  Administered 2017-05-31: 20 mg via ORAL
  Filled 2017-05-31: qty 1

## 2017-05-31 MED ORDER — PREDNISONE 20 MG PO TABS: 20.0000 mg | ORAL_TABLET | Freq: Every day | ORAL | 0 refills | Status: AC

## 2017-05-31 NOTE — Discharge Instructions (Signed)
We do not know specifically what is causing your rash, but it does appear to be an allergic reaction rather than an infection or an indication of a more severe underlying illness.  Please take any prescribed medications as written and continue taking your normal prescription medications unless otherwise specified.  Follow up with the recommended physicians and return to the emergency department with any new or worsening symptoms that concern you, including but not limited to fever, lesions inside your mouth, etc. ° °

## 2017-05-31 NOTE — ED Provider Notes (Signed)
Emergency Department Provider Note   I have reviewed the triage vital signs and the nursing notes.   HISTORY  Chief Complaint Rash   HPI Jacqueline Forbes is a 31 y.o. female with PMH of asthma presents to the emergency department for evaluation of subjective fever, sore throat, mild dyspnea with dry cough, itchy rash over the legs, trunk, arms, neck.  Patient states that symptoms began yesterday with some pins and needles sensation over the right face.  No symptoms in the arms or legs.  Those resolved in the next morning the patient woke up with rash in the legs.  The rash was itchy and seemed to spread rapidly to her trunk, arms, face.  At that time she developed some sore throat with mild dry cough.  She is felt slightly more short of breath but does note a history of asthma.  She denies starting any new medications, herbal supplements, soaps, lotions.  No return of pins and needles symptoms.  No chest pain. No UTI symptoms. No radiation. No modifying factors.    Past Medical History:  Diagnosis Date  . Asthma   . Trichomonas infection   . Vaginal Pap smear, abnormal    2013 LSIL    There are no active problems to display for this patient.   Past Surgical History:  Procedure Laterality Date  . COLPOSCOPY  11/2011  . OOPHORECTOMY      Current Outpatient Rx  . Order #: [id number] Class: Print  . Order #: [id number] Class: Normal  . Order #: [id number] Class: Historical Med  . Order #: [id number] Class: Normal  . Order #: [id number] Class: Print    Allergies Metronidazole; Septra [sulfamethoxazole-trimethoprim]; Sulfa antibiotics; and Tylenol with codeine #3 [acetaminophen-codeine]  Family History  Problem Relation Age of Onset  . Arthritis Father   . Depression Father   . Arthritis Mother   . Cancer Mother        breast    Social History Social History   Tobacco Use  . Smoking status: Current Every Day Smoker    Packs/day: 0.50    Years: 10.00    Pack years: 5.00     Types: Cigarettes  . Smokeless tobacco: Never Used  Substance Use Topics  . Alcohol use: No  . Drug use: No    Review of Systems  Constitutional: Positive fever. No chills.  Eyes: No visual changes. ENT: Positive sore throat. Cardiovascular: Denies chest pain. Respiratory: Positive shortness of breath with dry cough.  Gastrointestinal: No abdominal pain.  No nausea, no vomiting.  No diarrhea.  No constipation. Genitourinary: Negative for dysuria. Musculoskeletal: Negative for back pain. Skin: Positive diffuse itchy rash.  Neurological: Negative for focal weakness or numbness. Positive mild HA.   10-point ROS otherwise negative.  ____________________________________________   PHYSICAL EXAM:  VITAL SIGNS: ED Triage Vitals  Enc Vitals Group     BP 05/31/17 1626 110/83     Pulse Rate 05/31/17 1626 73     Resp 05/31/17 1626 18     Temp 05/31/17 1626 98.1 F (36.7 C)     Temp Source 05/31/17 1626 Oral     SpO2 05/31/17 1626 98 %     Weight 05/31/17 1623 142 lb (64.4 kg)     Height 05/31/17 1623 5\' 4"  (1.626 m)     Pain Score 05/31/17 1625 7   Constitutional: Alert and oriented. Well appearing and in no acute distress. Eyes: Conjunctivae are normal.  Head: Atraumatic. Nose: No congestion/rhinnorhea. Mouth/Throat: Mucous membranes are moist.  Oropharynx is diffusely erythematous with no exudate or PTA.  Neck: No stridor.  Cardiovascular: Normal rate, regular rhythm. Good peripheral circulation. Grossly normal heart sounds.   Respiratory: Normal respiratory effort.  No retractions. Lungs CTAB. Gastrointestinal: Soft and nontender. No distention.  Musculoskeletal: No lower extremity tenderness nor edema. No gross deformities of extremities. Neurologic:  Normal speech and language. No gross focal neurologic deficits are appreciated.  Skin:  Skin is warm, dry and intact. Diffuse palpable rash over the legs, back, arms, and neck. No ulcerations. No petechiae. No underlying  erythema.   ____________________________________________   LABS (all labs ordered are listed, but only abnormal results are displayed)  Labs Reviewed  CBC WITH DIFFERENTIAL/PLATELET - Abnormal; Notable for the following components:      Result Value   MCV 102.0 (*)    MCH 34.5 (*)    All other components within normal limits  RAPID STREP SCREEN (NOT AT [facility])  CULTURE, GROUP A STREP [facility]  BASIC METABOLIC PANEL  PREGNANCY, URINE   ____________________________________________  RADIOLOGY  None ____________________________________________   PROCEDURES  Procedure(s) performed:   Procedures  None ____________________________________________   INITIAL IMPRESSION / ASSESSMENT AND PLAN / ED COURSE  Pertinent labs & imaging results that were available during my care of the patient were reviewed by me and considered in my medical decision making (see chart for details).  Patient presents to the emergency department for evaluation of diffuse, itchy rash.  The rashes palpable with no surrounding erythema.  Patient describes it as itchy.  Also has associated viral symptoms with sore throat, cough. Not consistent with scarlet fever rash. No hives or evidence of acute severe allergic reaction. Pins/needles sensation over face has resolved. Will obtain blood work to r/o electrolyte abnormality and to assess for leukocytosis. Swab for rapid strep. Will reassess.   05:43 PM Patient's lab work is unremarkable.  Strep negative.  Plan for brief steroid burst and Tessalon for cough. Provided contact information for local low-cost PCP.   At this time, I do not feel there is any life-threatening condition present. I have reviewed and discussed all results (EKG, imaging, lab, urine as appropriate), exam findings with patient. I have reviewed nursing notes and appropriate previous records.  I feel the patient is safe to be discharged home without further emergent workup. Discussed usual and  customary return precautions. Patient and family (if present) verbalize understanding and are comfortable with this plan.  Patient will follow-up with their primary care provider. If they do not have a primary care provider, information for follow-up has been provided to them. All questions have been answered.  ____________________________________________  FINAL CLINICAL IMPRESSION(S) / ED DIAGNOSES  Final diagnoses:  Rash  Sore throat  Cough     MEDICATIONS GIVEN DURING THIS VISIT:  None  NEW OUTPATIENT MEDICATIONS STARTED DURING THIS VISIT:  Prednisone 20 mg daily x 5 days  Tessalon   Note:  This document was prepared using Dragon voice recognition software and may include unintentional dictation errors.  Alona BeneJoshua Long, MD Emergency Medicine    Long, Arlyss RepressJoshua G, MD 05/31/17 [phone]

## 2017-05-31 NOTE — ED Triage Notes (Signed)
Patient states that she has rash to entire body with itching, fever, and sore throat. Patient states took a benadryl at 9pm last night with improvement to itching. Patient states "It started Friday night. I had numbness and tingling to the left side of my face, lips, and both my arms, I went to sleep and woke up the next morning with the rash. Denies any new foods, medications, lotion, etc.

## 2017-06-03 ENCOUNTER — Encounter: Payer: Self-pay | Admitting: Obstetrics & Gynecology

## 2017-06-03 ENCOUNTER — Ambulatory Visit (INDEPENDENT_AMBULATORY_CARE_PROVIDER_SITE_OTHER): Payer: Self-pay | Admitting: Obstetrics & Gynecology

## 2017-06-03 VITALS — BP 102/56 | HR 60 | Ht 64.0 in | Wt 143.0 lb

## 2017-06-03 DIAGNOSIS — B373 Candidiasis of vulva and vagina: Secondary | ICD-10-CM

## 2017-06-03 DIAGNOSIS — B3731 Acute candidiasis of vulva and vagina: Secondary | ICD-10-CM

## 2017-06-03 LAB — CULTURE, GROUP A STREP (THRC)

## 2017-06-03 MED ORDER — FLUCONAZOLE 150 MG PO TABS: 150.0000 mg | ORAL_TABLET | Freq: Once | ORAL | 0 refills | Status: AC

## 2017-06-18 NOTE — Progress Notes (Signed)
       Chief Complaint  Patient presents with  . Follow-up    Blood pressure (!) 102/56, pulse 60, height 5\' 4"  (1.626 m), weight 143 lb (64.9 kg), last menstrual period 05/28/2017.  31 y.o. G0P0000 Patient's last menstrual period was 05/28/2017. The current method of family planning is none.  Subjective Patient is back in for follow-up from her treatment with oral Cleocin for bacterial vaginosis She states her symptoms of malodorous discharge have resolved but she is having some itching which started after she finished therapy She states she really hasn't seen much in the way of discharge and if we has had no odor  Previous treatment oral Cleocin  Objective Vulva:  normal appearing vulva with no masses, tenderness or lesions Vagina:  normal mucosa, curd-like discharge Cervix:  no cervical motion tenderness and no lesions Uterus:   Adnexa: ovaries:,       Pertinent ROS No burning with urination, frequency or urgency No nausea, vomiting or diarrhea Nor fever chills or other constitutional symptoms   Labs or studies Wet Prep:   A sample of vaginal discharge was obtained from the posterior fornix using a cotton swab. 2 drops of saline were placed on a slide and the cotton swab was immersed in the saline. Microscopic evaluation was performed and results were as follows:  Positive  for yeast  Negative for clue cells , consistent with Bacterial vaginosis Negative for trichomonas  Normal WBC population   Whiff test: Negative     Impression Diagnoses this Encounter::   ICD-10-CM   1. Yeast vaginitis B37.3     Established relevant diagnosis(es):   Plan/Recommendations: Meds ordered this encounter  Medications  . fluconazole (DIFLUCAN) 150 MG tablet    Sig: Take 1 tablet (150 mg total) once for 1 dose by mouth. Take the second tablet 3 days after the first one.    Dispense:  2 tablet    Refill:  0    Labs or Scans Ordered: No orders of the defined types  were placed in this encounter.   Management:: Patient has a post antibiotic yeast infection is given Diflucan 2 tablets Should she have lingering symptoms she'll give me a call  Follow up Return if symptoms worsen or fail to improve.     All questions were answered.

## 2017-07-31 ENCOUNTER — Telehealth: Payer: Self-pay | Admitting: Obstetrics & Gynecology

## 2017-07-31 ENCOUNTER — Other Ambulatory Visit: Payer: Self-pay | Admitting: Obstetrics & Gynecology

## 2017-07-31 MED ORDER — TERCONAZOLE 0.4 % VA CREA
1.0000 | TOPICAL_CREAM | Freq: Every day | VAGINAL | 0 refills | Status: DC
Start: 2017-07-31 — End: 2018-04-23

## 2017-07-31 NOTE — Telephone Encounter (Signed)
Patient called stating her yeast infection is back .She did take all of the last prescription of Diflucan in which it cleared up.  Pt states she was to let you know if it returned.  Please advise.

## 2017-12-01 ENCOUNTER — Telehealth: Payer: Self-pay | Admitting: *Deleted

## 2017-12-01 NOTE — Telephone Encounter (Signed)
Patient states she has BV, discharge with odor. Requesting medication. Please advise.

## 2017-12-02 ENCOUNTER — Telehealth: Payer: Self-pay | Admitting: Obstetrics & Gynecology

## 2017-12-02 MED ORDER — METRONIDAZOLE 0.75 % VA GEL: VAGINAL | 0 refills | Status: DC

## 2017-12-02 NOTE — Telephone Encounter (Signed)
Meds ordered this encounter  Medications  . metroNIDAZOLE (METROGEL VAGINAL) 0.75 % vaginal gel    Sig: Nightly x 5 nights    Dispense:  70 g    Refill:  0    

## 2017-12-02 NOTE — Telephone Encounter (Signed)
Patient informed metrogel was sent.

## 2018-04-23 ENCOUNTER — Ambulatory Visit (INDEPENDENT_AMBULATORY_CARE_PROVIDER_SITE_OTHER): Payer: Self-pay | Admitting: Advanced Practice Midwife

## 2018-04-23 ENCOUNTER — Encounter (INDEPENDENT_AMBULATORY_CARE_PROVIDER_SITE_OTHER): Payer: Self-pay

## 2018-04-23 ENCOUNTER — Encounter: Payer: Self-pay | Admitting: Advanced Practice Midwife

## 2018-04-23 ENCOUNTER — Other Ambulatory Visit: Payer: Self-pay

## 2018-04-23 VITALS — BP 111/68 | HR 56 | Ht 64.5 in | Wt 144.0 lb

## 2018-04-23 DIAGNOSIS — N750 Cyst of Bartholin's gland: Secondary | ICD-10-CM

## 2018-04-23 NOTE — Progress Notes (Signed)
[facility] Visit  Patient name: Jacqueline Forbes MRN [id number]  Date of birth: [birth year]  CC & HPI:  Jacqueline Forbes is a 32 y.o. Caucasian female presenting today for lump on left side of vagina, smaller and less inflamed now.  Some ext vaginal itch;  Works in [employer], sweats a lot  Very difficult to have an orgasm:  Started Lexapro last December, increased dosage 6 m o nths or so ago.  Girlfriend lives in [place], but even masturbating is hard.   Pertinent History Reviewed:  Medical & Surgical Hx:   Past Medical History:  Diagnosis Date  . Asthma   . Mental disorder    depression  . Trichomonas infection   . Vaginal Pap smear, abnormal    2013 LSIL   Past Surgical History:  Procedure Laterality Date  . COLPOSCOPY  11/2011  . OOPHORECTOMY     Family History  Problem Relation Age of Onset  . Arthritis Father   . Depression Father   . Arthritis Mother   . Cancer Brother     Current Outpatient Medications:  .  escitalopram (LEXAPRO) 20 MG tablet, Take 20 mg by mouth daily., Disp: , Rfl:  .  loratadine (CLARITIN) 10 MG tablet, Take 10 mg by mouth daily., Disp: , Rfl:  .  naproxen sodium (ANAPROX DS) 550 MG tablet, Take 1 tablet (550 mg total) by mouth 2 (two) times daily with a meal., Disp: 60 tablet, Rfl: 1 Social History: Reviewed -  reports that she has quit smoking. Her smoking use included e-cigarettes. She has a 5.00 pack-year smoking history. She has never used smokeless tobacco.  Review of Systems:   Constitutional: Negative for fever and chills Eyes: Negative for visual disturbances Respiratory: Negative for shortness of breath, dyspnea Cardiovascular: Negative for chest pain or palpitations  Gastrointestinal: Negative for vomiting, diarrhea and constipation; no abdominal pain Genitourinary: Negative for dysuria and urgency, vaginal irritation or itching Musculoskeletal: Negative for back pain, joint pain, myalgias  Neurological: Negative  for dizziness and headaches    Objective Findings:    Physical Examination: Vitals:   04/23/18 1534  BP: 111/68  Pulse: (!) 56   General appearance - well appearing, and in no distress Mental status - alert, oriented to person, place, and time Chest:  Normal respiratory effort Heart - normal rate and regular rhythm Abdomen:  Soft, nontender Pelvic: small bartholin gland cyst on left; SSE:  Normal appearing DC w/o odor .Wet prep negative Musculoskeletal:  Normal range of motion without pain Extremities:  No edema    No results found for this or any previous visit (from the past 24 hour(s)).    Assessment & Plan:  A:   Bartholin gland cyst, resolving  Itch most likely 2/2 moisture changes: use barrier cream/gold bond  inorgasmia 2/2 SSRI P:  No tx needed for Bartholin gland cyst. Call if it flares back up    Wellbutrin may be less likely to affect orgasms, but don't try to change up meds while still having difficulty mentally  No follow-ups on file.  Jacqueline Forbes CNM 04/23/2018 3:38 PM

## 2018-04-23 NOTE — Patient Instructions (Addendum)
Bartholin Cyst or Abscess A Bartholin cyst is a fluid-filled sac that forms on a Bartholin gland. Bartholin glands are small glands that are located within the folds of skin (labia) along the sides of the lower opening of the vagina. These glands produce a fluid to moisten the outside of the vagina during sexual intercourse. A Bartholin cyst causes a bulge on the side of the vagina. A cyst that is not large or infected may not cause symptoms or problems. However, if the fluid within the cyst becomes infected, the cyst can turn into an abscess. An abscess may cause discomfort or pain. What are the causes? A Bartholin cyst may develop when the duct of the gland becomes blocked. In many cases, the cause of this is not known. Various kinds of bacteria can cause the cyst to become infected and develop into an abscess. What increases the risk? You may be at an increased risk of developing a Bartholin cyst or abscess if:  You are a woman of reproductive age.  You have a history of previous Bartholin cysts or abscesses.  You have diabetes.  You have a sexually transmitted disease (STD).  What are the signs or symptoms? The severity of symptoms varies depending on the size of the cyst and whether it is infected. Symptoms may include:  A bulge or swelling near the lower opening of your vagina.  Discomfort or pain.  Redness.  Pain during sexual intercourse.  Pain when walking.  Fluid draining from the area.  How is this diagnosed? Your health care provider may make a diagnosis based on your symptoms and a physical exam. He or she will look for swelling in your vaginal area. Blood tests may be done to check for infections. A sample of fluid from the cyst or abscess may also be taken to be tested in a lab. How is this treated? Small cysts that are not infected may not require any treatment. These often go away on their own. Yourhealth care provider will recommend hot baths and the use of warm  compresses. These may also be part of the treatment for an abscess. Treatment options for a large cyst or abscess may include:  Antibiotic medicine.  A surgical procedure to drain the abscess. One of the following procedures may be done: ? Incision and drainage. An incision is made in the cyst or abscess so that the fluid drains out. A catheter may be placed inside the cyst so that it does not close and fill up with fluid again. The catheter will be removed after you have a follow-up visit with a specialist (gynecologist). ? Marsupialization. The cyst or abscess is opened and kept open by stitching the edges of the skin to the walls of the cyst or abscess. This allows it to continue to drain and not fill up with fluid again.  If you have cysts or abscesses that keep returning and have required incision and drainage multiple times, your health care provider may talk to you about surgery to remove the Bartholin gland. Follow these instructions at home:  Take medicines only as directed by your health care provider.  If you were prescribed an antibiotic medicine, finish it all even if you start to feel better.  Apply warm, wet compresses to the area or take warm, shallow baths that cover your pelvic region (sitz baths) several times a day or as directed by your health care provider.  Do not squeeze the cyst or apply heavy pressure to it.    Do not have sexual intercourse until the cyst has gone away.  If your cyst or abscess was opened, a small piece of gauze or a drain may have been placed in the area to allow drainage. Do not remove the gauze or the drain until directed by your health care provider.  Wear feminine pads-not tampons-as needed for any drainage or bleeding.  Keep all follow-up visits as directed by your health care provider. This is important. How is this prevented? Take these steps to help prevent a Bartholin cyst from returning:  Practice good hygiene.  Clean your vaginal  area with mild soap and a soft cloth when you bathe.  Practice safe sex to prevent STDs.  Contact a health care provider if:  You have increased pain, swelling, or redness in the area of the cyst.  Puslike drainage is coming from the cyst.  You have a fever. This information is not intended to replace advice given to you by your health care provider. Make sure you discuss any questions you have with your health care provider. Document Released: 07/08/2005 Document Revised: 12/14/2015 Document Reviewed: 02/21/2014 Elsevier Interactive Patient Education  2018 [employer].    Wellbutrin is less likely to cause inorgasmia than Lexapro.

## 2019-02-23 ENCOUNTER — Telehealth: Payer: Self-pay | Admitting: Obstetrics & Gynecology

## 2019-02-23 NOTE — Telephone Encounter (Signed)
Patient called, thinks she has BV, she is having an itchy discharge.  Wants an appointment, please advise.  [place]   [phone]

## 2019-02-24 NOTE — Telephone Encounter (Signed)
I called the patient, had her on the phone and we were disconnected.  I called her back, got her voicemail, left her a message to call and we'll get her scheduled.

## 2019-03-01 ENCOUNTER — Telehealth: Payer: Self-pay | Admitting: Advanced Practice Midwife

## 2019-03-01 NOTE — Telephone Encounter (Signed)

## 2019-03-02 ENCOUNTER — Other Ambulatory Visit: Payer: Self-pay

## 2019-03-02 ENCOUNTER — Encounter: Payer: Self-pay | Admitting: Advanced Practice Midwife

## 2019-03-02 ENCOUNTER — Ambulatory Visit (INDEPENDENT_AMBULATORY_CARE_PROVIDER_SITE_OTHER): Payer: Self-pay | Admitting: Advanced Practice Midwife

## 2019-03-02 VITALS — BP 93/61 | HR 80 | Ht 64.2 in | Wt 162.0 lb

## 2019-03-02 DIAGNOSIS — N898 Other specified noninflammatory disorders of vagina: Secondary | ICD-10-CM

## 2019-03-02 NOTE — Progress Notes (Signed)
[facility] Visit  Patient name: Jacqueline Forbes MRN [id number]  Date of birth: Dec 06, 1985  CC & HPI:  Jacqueline Forbes is a 33 y.o. Caucasian female presenting today for vaginal discharge, some odor (not fishy) some itch for a few weeks. Had sex w/a new partner, wants to make sure everything is OK. Just picked up a vaginal probiotic. Has a 1/2 tube of metrogel left over, no insurance. Hasn't started it. Advised that STD/infection testing could potentially cost several hundreds of dollars--pt wants to proceed.   Pertinent History Reviewed:  Medical & Surgical Hx:   Past Medical History:  Diagnosis Date  . Asthma   . Mental disorder    depression  . Trichomonas infection   . Vaginal Pap smear, abnormal    2013 LSIL   Past Surgical History:  Procedure Laterality Date  . COLPOSCOPY  11/2011  . OOPHORECTOMY     Family History  Problem Relation Age of Onset  . Arthritis Father   . Depression Father   . Arthritis Mother   . Cancer Brother     Current Outpatient Medications:  .  loratadine (CLARITIN) 10 MG tablet, Take 10 mg by mouth daily., Disp: , Rfl:  .  naproxen sodium (ANAPROX DS) 550 MG tablet, Take 1 tablet (550 mg total) by mouth 2 (two) times daily with a meal., Disp: 60 tablet, Rfl: 1 .  escitalopram (LEXAPRO) 20 MG tablet, Take 20 mg by mouth daily., Disp: , Rfl:  Social History: Reviewed -  reports that she has quit smoking. Her smoking use included e-cigarettes. She has a 5.00 pack-year smoking history. She has never used smokeless tobacco.  Review of Systems:   Constitutional: Negative for fever and chills Eyes: Negative for visual disturbances Respiratory: Negative for shortness of breath, dyspnea Cardiovascular: Negative for chest pain or palpitations  Gastrointestinal: Negative for vomiting, diarrhea and constipation; no abdominal pain Genitourinary: Negative for dysuria and urgency, vaginal irritation or itching Musculoskeletal: Negative for  back pain, joint pain, myalgias  Neurological: Negative for dizziness and headaches    Objective Findings:    Physical Examination: Vitals:   03/02/19 1605  BP: 93/61  Pulse: 80   General appearance - well appearing, and in no distress Mental status - alert, oriented to person, place, and time Chest:  Normal respiratory effort Heart - normal rate and regular rhythm Abdomen:  Soft, nontender Pelvic: SSE:  Normal appearing discharge, no odor  Wet prep negative.  Musculoskeletal:  Normal range of motion without pain Extremities:  No edema    No results found for this or any previous visit (from the past 24 hour(s)).    Assessment & Plan:  A:   Normal appearing vaginal dishcarge P:   Orders Placed This Encounter  Procedures  . NuSwab Vaginitis Plus (VG+)       No follow-ups on file.  Christin Fudge CNM 03/03/2019 12:47 PM

## 2019-03-04 ENCOUNTER — Other Ambulatory Visit: Payer: Self-pay | Admitting: Advanced Practice Midwife

## 2019-03-04 MED ORDER — METRONIDAZOLE 0.75 % VA GEL: 1.0000 | Freq: Every day | VAGINAL | 0 refills | Status: DC

## 2019-03-05 LAB — NUSWAB VAGINITIS PLUS (VG+)
Atopobium vaginae: HIGH Score — AB
BVAB 2: HIGH Score — AB
Candida albicans, NAA: NEGATIVE
Candida glabrata, NAA: NEGATIVE
Chlamydia trachomatis, NAA: NEGATIVE
Megasphaera 1: HIGH Score — AB
Neisseria gonorrhoeae, NAA: NEGATIVE
Trich vag by NAA: NEGATIVE

## 2019-03-08 ENCOUNTER — Telehealth: Payer: Self-pay | Admitting: Advanced Practice Midwife

## 2019-03-08 NOTE — Telephone Encounter (Signed)
Pt would like to see if she is ok to continue her BV medication while on her menstrual cycle.

## 2019-03-08 NOTE — Telephone Encounter (Signed)
Pt informed that it is still ok to use the metrogel.

## 2019-09-29 ENCOUNTER — Ambulatory Visit: Payer: Self-pay | Admitting: Obstetrics and Gynecology

## 2019-09-29 ENCOUNTER — Encounter (HOSPITAL_COMMUNITY): Payer: Self-pay

## 2019-09-29 ENCOUNTER — Emergency Department (HOSPITAL_COMMUNITY): Payer: Self-pay

## 2019-09-29 ENCOUNTER — Other Ambulatory Visit: Payer: Self-pay

## 2019-09-29 ENCOUNTER — Telehealth: Payer: Self-pay | Admitting: *Deleted

## 2019-09-29 ENCOUNTER — Emergency Department (HOSPITAL_COMMUNITY)
Admission: EM | Admit: 2019-09-29 | Discharge: 2019-09-29 | Disposition: A | Discharge status: Home / Self Care | Payer: Self-pay | Attending: Emergency Medicine | Admitting: Emergency Medicine

## 2019-09-29 DIAGNOSIS — H9203 Otalgia, bilateral: Secondary | ICD-10-CM | POA: Insufficient documentation

## 2019-09-29 DIAGNOSIS — R509 Fever, unspecified: Secondary | ICD-10-CM | POA: Insufficient documentation

## 2019-09-29 DIAGNOSIS — R0981 Nasal congestion: Secondary | ICD-10-CM | POA: Insufficient documentation

## 2019-09-29 DIAGNOSIS — R102 Pelvic and perineal pain: Secondary | ICD-10-CM | POA: Insufficient documentation

## 2019-09-29 DIAGNOSIS — R05 Cough: Secondary | ICD-10-CM | POA: Insufficient documentation

## 2019-09-29 DIAGNOSIS — Z79899 Other long term (current) drug therapy: Secondary | ICD-10-CM | POA: Insufficient documentation

## 2019-09-29 DIAGNOSIS — M545 Low back pain: Secondary | ICD-10-CM | POA: Insufficient documentation

## 2019-09-29 DIAGNOSIS — M7918 Myalgia, other site: Secondary | ICD-10-CM | POA: Insufficient documentation

## 2019-09-29 DIAGNOSIS — Z87891 Personal history of nicotine dependence: Secondary | ICD-10-CM | POA: Insufficient documentation

## 2019-09-29 DIAGNOSIS — J45909 Unspecified asthma, uncomplicated: Secondary | ICD-10-CM | POA: Insufficient documentation

## 2019-09-29 LAB — GROUP A STREP BY PCR: Group A Strep by PCR: NOT DETECTED

## 2019-09-29 LAB — WET PREP, GENITAL
Sperm: NONE SEEN
Trich, Wet Prep: NONE SEEN
Yeast Wet Prep HPF POC: NONE SEEN

## 2019-09-29 LAB — URINALYSIS, ROUTINE W REFLEX MICROSCOPIC
Bilirubin Urine: NEGATIVE
Glucose, UA: NEGATIVE mg/dL
Ketones, ur: NEGATIVE mg/dL
Leukocytes,Ua: NEGATIVE
Nitrite: NEGATIVE
Protein, ur: NEGATIVE mg/dL
Specific Gravity, Urine: 1.014 (ref 1.005–1.030)
pH: 5 (ref 5.0–8.0)

## 2019-09-29 LAB — PREGNANCY, URINE: Preg Test, Ur: NEGATIVE

## 2019-09-29 MED ORDER — FLUTICASONE PROPIONATE 50 MCG/ACT NA SUSP: 1.0000 | Freq: Every day | NASAL | 0 refills | Status: DC | PRN

## 2019-09-29 MED ORDER — METHOCARBAMOL 500 MG PO TABS: 500.0000 mg | ORAL_TABLET | Freq: Three times a day (TID) | ORAL | 0 refills | Status: DC | PRN

## 2019-09-29 MED ORDER — CLINDAMYCIN PHOSPHATE 2 % VA CREA: 1.0000 | TOPICAL_CREAM | Freq: Every day | VAGINAL | 0 refills | Status: DC

## 2019-09-29 MED ORDER — NAPROXEN 500 MG PO TABS: 500.0000 mg | ORAL_TABLET | Freq: Two times a day (BID) | ORAL | 0 refills | Status: DC

## 2019-09-29 NOTE — Telephone Encounter (Signed)
Mailbox full @ 12:31 pm. Pt has appt this afternoon. Encounter closed. JSY

## 2019-09-29 NOTE — Telephone Encounter (Signed)
Patient arrived in the parking and called in.  I spoke to Townsend who spoke to Casey, since the patient is having COVID symptoms, she was advised to follow up with an Urgent Care.  I explained this to the patient.

## 2019-09-29 NOTE — ED Triage Notes (Signed)
Pt presents to ED with complaints of lower back pain and pelvic pain started last night. Pt denies urinary symptoms

## 2019-09-29 NOTE — Discharge Instructions (Addendum)
You were seen in the emergency department today for fever as well as several other symptoms.  Your chest x-ray was normal.  Your strep test was negative. Your pregnancy test was negative.   Your wet prep showed bacterial vaginosis- we are treating this with intra-vaginal clindamycin- please use once per night for 1 week.   We suspect your additional symptoms may be viral.   We are sending you home with the following medicines:   - Naproxen is a nonsteroidal anti-inflammatory medication that will help with pain and swelling. Be sure to take this medication as prescribed with food, 1 pill every 12 hours,  It should be taken with food, as it can cause stomach upset, and more seriously, stomach bleeding. Do not take other nonsteroidal anti-inflammatory medications with this such as Advil, Motrin, Aleve, Mobic, Goodie Powder, or Motrin.    - Robaxin is the muscle relaxer I have prescribed, this is meant to help with muscle tightness. Be aware that this medication may make you drowsy therefore the first time you take this it should be at a time you are in an environment where you can rest. Do not drive or operate heavy machinery when taking this medication. Do not drink alcohol or take other sedating medications with this medicine such as narcotics or benzodiazepines.   - Flonase- use 1 spray per nostril daily as needed for nasal congestion.   You make take Tylenol per over the counter dosing with these medications.   We have prescribed you new medication(s) today. Discuss the medications prescribed today with your pharmacist as they can have adverse effects and interactions with your other medicines including over the counter and prescribed medications. Seek medical evaluation if you start to experience new or abnormal symptoms after taking one of these medicines, seek care immediately if you start to experience difficulty breathing, feeling of your throat closing, facial swelling, or rash as these could  be indications of a more serious allergic reaction   While your initial covid test was negative, there is still a chance you have covid 19 and had a false negative test. Please follow below instructions.   We are instructing patient's with COVID 19 or symptoms of COVID 19 to quarantine themselves for 14 days. You may be able to discontinue self quarantine if the following conditions are met:   Persons with COVID-19 who have symptoms and were directed to care for themselves at home may discontinue home isolation under the  following conditions: - It has been at least 7 days have passed since symptoms first appeared. - AND at least 3 days (72 hours) have passed since recovery defined as resolution of fever without the use of fever-reducing medications and improvement in respiratory symptoms (e.g., cough, shortness of breath)  Please follow the below quarantine instructions.   Please follow up with primary care within 3-5 days for re-evaluation- call prior to going to the office to make them aware of your symptoms as some offices are altering their method of seeing patients with COVID 19 symptoms. Return to the ER for new or worsening symptoms including but not limited to increased work of breathing, chest pain, passing out, numbness, weakness, or any other concerns.       Person Under Monitoring Name: Jacqueline Forbes  Location: [place] [address]   Infection Prevention Recommendations for Individuals Confirmed to have, or Being Evaluated for, 2019 Novel Coronavirus (COVID-19) Infection Who Receive Care at Home  Individuals who are confirmed to  have, or are being evaluated for, COVID-19 should follow the prevention steps below until a healthcare provider or local or state health department says they can return to normal activities.  Stay home except to get medical care You should restrict activities outside your home, except for getting medical care. Do not go to  work, school, or public areas, and do not use public transportation or taxis.  Call ahead before visiting your doctor Before your medical appointment, call the healthcare provider and tell them that you have, or are being evaluated for, COVID-19 infection. This will help the healthcare provider's office take steps to keep other people from getting infected. Ask your healthcare provider to call the local or state health department.  Monitor your symptoms Seek prompt medical attention if your illness is worsening (e.g., difficulty breathing). Before going to your medical appointment, call the healthcare provider and tell them that you have, or are being evaluated for, COVID-19 infection. Ask your healthcare provider to call the local or state health department.  Wear a facemask You should wear a facemask that covers your nose and mouth when you are in the same room with other people and when you visit a healthcare provider. People who live with or visit you should also wear a facemask while they are in the same room with you.  Separate yourself from other people in your home As much as possible, you should stay in a different room from other people in your home. Also, you should use a separate bathroom, if available.  Avoid sharing household items You should not share dishes, drinking glasses, cups, eating utensils, towels, bedding, or other items with other people in your home. After using these items, you should wash them thoroughly with soap and water.  Cover your coughs and sneezes Cover your mouth and nose with a tissue when you cough or sneeze, or you can cough or sneeze into your sleeve. Throw used tissues in a lined trash can, and immediately wash your hands with soap and water for at least 20 seconds or use an alcohol-based hand rub.  Wash your [facility] your hands often and thoroughly with soap and water for at least 20 seconds. You can use an alcohol-based hand sanitizer if  soap and water are not available and if your hands are not visibly dirty. Avoid touching your eyes, nose, and mouth with unwashed hands.   Prevention Steps for Caregivers and Household Members of Individuals Confirmed to have, or Being Evaluated for, COVID-19 Infection Being Cared for in the Home  If you live with, or provide care at home for, a person confirmed to have, or being evaluated for, COVID-19 infection please follow these guidelines to prevent infection:  Follow healthcare provider's instructions Make sure that you understand and can help the patient follow any healthcare provider instructions for all care.  Provide for the patient's basic needs You should help the patient with basic needs in the home and provide support for getting groceries, prescriptions, and other personal needs.  Monitor the patient's symptoms If they are getting sicker, call his or her medical provider and tell them that the patient has, or is being evaluated for, COVID-19 infection. This will help the healthcare provider's office take steps to keep other people from getting infected. Ask the healthcare provider to call the local or state health department.  Limit the number of people who have contact with the patient If possible, have only one caregiver for the patient. Other household members should  stay in another home or place of residence. If this is not possible, they should stay in another room, or be separated from the patient as much as possible. Use a separate bathroom, if available. Restrict visitors who do not have an essential need to be in the home.  Keep older adults, very young children, and other sick people away from the patient Keep older adults, very young children, and those who have compromised immune systems or chronic health conditions away from the patient. This includes people with chronic heart, lung, or kidney conditions, diabetes, and cancer.  Ensure good ventilation Make  sure that shared spaces in the home have good air flow, such as from an air conditioner or an opened window, weather permitting.  Wash your hands often Wash your hands often and thoroughly with soap and water for at least 20 seconds. You can use an alcohol based hand sanitizer if soap and water are not available and if your hands are not visibly dirty. Avoid touching your eyes, nose, and mouth with unwashed hands. Use disposable paper towels to dry your hands. If not available, use dedicated cloth towels and replace them when they become wet.  Wear a facemask and gloves Wear a disposable facemask at all times in the room and gloves when you touch or have contact with the patient's blood, body fluids, and/or secretions or excretions, such as sweat, saliva, sputum, nasal mucus, vomit, urine, or feces.  Ensure the mask fits over your nose and mouth tightly, and do not touch it during use. Throw out disposable facemasks and gloves after using them. Do not reuse. Wash your hands immediately after removing your facemask and gloves. If your personal clothing becomes contaminated, carefully remove clothing and launder. Wash your hands after handling contaminated clothing. Place all used disposable facemasks, gloves, and other waste in a lined container before disposing them with other household waste. Remove gloves and wash your hands immediately after handling these items.  Do not share dishes, glasses, or other household items with the patient Avoid sharing household items. You should not share dishes, drinking glasses, cups, eating utensils, towels, bedding, or other items with a patient who is confirmed to have, or being evaluated for, COVID-19 infection. After the person uses these items, you should wash them thoroughly with soap and water.  Wash laundry thoroughly Immediately remove and wash clothes or bedding that have blood, body fluids, and/or secretions or excretions, such as sweat, saliva,  sputum, nasal mucus, vomit, urine, or feces, on them. Wear gloves when handling laundry from the patient. Read and follow directions on labels of laundry or clothing items and detergent. In general, wash and dry with the warmest temperatures recommended on the label.  Clean all areas the individual has used often Clean all touchable surfaces, such as counters, tabletops, doorknobs, bathroom fixtures, toilets, phones, keyboards, tablets, and bedside tables, every day. Also, clean any surfaces that may have blood, body fluids, and/or secretions or excretions on them. Wear gloves when cleaning surfaces the patient has come in contact with. Use a diluted bleach solution (e.g., dilute bleach with 1 part bleach and 10 parts water) or a household disinfectant with a label that says EPA-registered for coronaviruses. To make a bleach solution at home, add 1 tablespoon of bleach to 1 quart (4 cups) of water. For a larger supply, add  cup of bleach to 1 gallon (16 cups) of water. Read labels of cleaning products and follow recommendations provided on product labels. Labels contain instructions  for safe and effective use of the cleaning product including precautions you should take when applying the product, such as wearing gloves or eye protection and making sure you have good ventilation during use of the product. Remove gloves and wash hands immediately after cleaning.  Monitor yourself for signs and symptoms of illness Caregivers and household members are considered close contacts, should monitor their health, and will be asked to limit movement outside of the home to the extent possible. Follow the monitoring steps for close contacts listed on the symptom monitoring form.   ? If you have additional questions, contact your local health department or call the epidemiologist on call at [phone] (available 24/7). ? This guidance is subject to change. For the most up-to-date guidance from [facility], please  refer to their website: [email or web address]

## 2019-09-29 NOTE — ED Provider Notes (Signed)
[facility] EMERGENCY DEPARTMENT Provider Note   CSN: [id number] Arrival date & time: 09/29/19  1559     History Chief Complaint  Patient presents with  . Fever    Jacqueline Forbes is a 34 y.o. female with a history of asthma, depression, and prior trichomonas who presents to the emergency department with complaints of fever that began yesterday. Patient states that she has had fever with temp max 100.8 over the past 24 hours, improved some with motrin/tylenol, no other alleviating/aggravating factors. Reports associated nasal congestion, sore throat, bilateral ear pain, cough productive of green mucous sputum & body aches. Had COVID test today which was negative. Also developed lower back pain with pelvic cramping on arrival with onset of menses- sxs feel fairly similar to prior menstrual cramps but back pain is a bit worse. Denies dyspnea, chest pain, abdominal pain, N/V/D, or dysuria. Has had some mild discharge recently. Sexually active in a monogamous relationship with 1 female partner- last sexually active a few months ago, not concerned for STDs. Denies numbness, tingling, weakness, saddle anesthesia, incontinence to bowel/bladder, fever, chills, IV drug use, dysuria, or hx of cancer. Patient has not had prior back surgeries.   HPI     Past Medical History:  Diagnosis Date  . Asthma   . Mental disorder    depression  . Trichomonas infection   . Vaginal Pap smear, abnormal    2013 LSIL    There are no problems to display for this patient.   Past Surgical History:  Procedure Laterality Date  . COLPOSCOPY  11/2011  . OOPHORECTOMY       OB History    Gravida  0   Para  0   Term  0   Preterm  0   AB  0   Living  0     SAB  0   TAB  0   Ectopic  0   Multiple  0   Live Births  0           Family History  Problem Relation Age of Onset  . Arthritis Father   . Depression Father   . Arthritis Mother   . Cancer Brother     Social History    Tobacco Use  . Smoking status: Former Smoker    Packs/day: 0.50    Years: 10.00    Pack years: 5.00    Types: E-cigarettes  . Smokeless tobacco: Never Used  Substance Use Topics  . Alcohol use: No  . Drug use: No    Home Medications Prior to Admission medications   Medication Sig Start Date End Date Taking? Authorizing Provider  escitalopram (LEXAPRO) 20 MG tablet Take 20 mg by mouth daily.    [provider]  loratadine (CLARITIN) 10 MG tablet Take 10 mg by mouth daily.    [provider]  metroNIDAZOLE (METROGEL VAGINAL) 0.75 % vaginal gel Place 1 Applicatorful vaginally at bedtime. FOR 5 NIGHTS 03/04/19   Cresenzo-Dishmon, Scarlette Calico, CNM  naproxen sodium (ANAPROX DS) 550 MG tablet Take 1 tablet (550 mg total) by mouth 2 (two) times daily with a meal. 01/27/17   Lazaro Arms, MD    Allergies    Metronidazole, Septra [sulfamethoxazole-trimethoprim], Sulfa antibiotics, and Tylenol with codeine #3 [acetaminophen-codeine]  Review of Systems   Review of Systems  Constitutional: Positive for fever. Negative for chills.  HENT: Positive for congestion, ear pain and sore throat.   Respiratory: Positive for cough. Negative for shortness of  breath.   Cardiovascular: Negative for chest pain.  Gastrointestinal: Negative for diarrhea, nausea and vomiting.  Genitourinary: Positive for pelvic pain, vaginal bleeding and vaginal discharge. Negative for dysuria.  Musculoskeletal: Positive for back pain.  Neurological: Negative for weakness and numbness.       Negative for incontinence or saddle anesthesia.  All other systems reviewed and are negative.   Physical Exam Updated Vital Signs BP 127/87 (BP Location: Right Arm)   Pulse 89   Temp (!) 97.5 F (36.4 C) (Oral)   Resp 18   Ht 5' 4.5" (1.638 m)   Wt 70.3 kg   LMP 08/22/2019   SpO2 98%   BMI 26.19 kg/m   Physical Exam Vitals and nursing note reviewed. Exam conducted with a chaperone present.  Constitutional:       General: She is not in acute distress.    Appearance: She is well-developed.  HENT:     Head: Normocephalic and atraumatic.     Right Ear: Ear canal normal. Tympanic membrane is not perforated, erythematous, retracted or bulging.     Left Ear: Ear canal normal. Tympanic membrane is not perforated, erythematous, retracted or bulging.     Ears:     Comments: No mastoid erythema/swelling/tenderness.     Nose: Congestion present.     Right Sinus: No maxillary sinus tenderness or frontal sinus tenderness.     Left Sinus: No maxillary sinus tenderness or frontal sinus tenderness.     Mouth/Throat:     Pharynx: Uvula midline. Posterior oropharyngeal erythema (minimal) present. No oropharyngeal exudate.     Comments: Posterior oropharynx is symmetric appearing. Patient tolerating own secretions without difficulty. No trismus. No drooling. No hot potato voice. No swelling beneath the tongue, submandibular compartment is soft.  Eyes:     General:        Right eye: No discharge.        Left eye: No discharge.     Conjunctiva/sclera: Conjunctivae normal.     Pupils: Pupils are equal, round, and reactive to light.  Cardiovascular:     Rate and Rhythm: Normal rate and regular rhythm.     Heart sounds: No murmur.  Pulmonary:     Effort: Pulmonary effort is normal. No respiratory distress.     Breath sounds: Normal breath sounds. No wheezing, rhonchi or rales.  Abdominal:     General: There is no distension.     Palpations: Abdomen is soft.     Tenderness: There is no abdominal tenderness. There is no right CVA tenderness, left CVA tenderness, guarding or rebound.  Genitourinary:    Comments: Mild amount of blood present in vaginal vault.  No purulent discharge. No adnexal or cervical motion tenderness. Musculoskeletal:     Cervical back: Normal range of motion and neck supple. No edema or rigidity.     Comments: Back: Diffuse lumbar tenderness left greater than right paraspinal muscle  tenderness.  No point/focal vertebral tenderness or palpable step-off.  Lymphadenopathy:     Cervical: No cervical adenopathy.  Skin:    General: Skin is warm and dry.     Findings: No rash.  Neurological:     Mental Status: She is alert.     Comments: Sensation grossly intact bilateral lower extremities.  5 out of 5 strength with plantar dorsiflexion bilaterally.  Patient is ambulatory.  Psychiatric:        Behavior: Behavior normal.     ED Results / Procedures / Treatments   Labs (all labs  ordered are listed, but only abnormal results are displayed) Labs Reviewed  URINALYSIS, ROUTINE W REFLEX MICROSCOPIC - Abnormal; Notable for the following components:      Result Value   APPearance CLOUDY (*)    Hgb urine dipstick LARGE (*)    Bacteria, UA RARE (*)    All other components within normal limits  PREGNANCY, URINE    EKG None  Radiology DG Chest Portable 1 View  Result Date: 09/29/2019 CLINICAL DATA:  Cough, lower back pain and pelvic pain. EXAM: PORTABLE CHEST 1 VIEW COMPARISON:  03/23/2016 FINDINGS: Lungs are clear. No signs of pleural effusion. Cardiomediastinal contours are normal. Hilar structures are unremarkable. Visualized skeletal structures are unremarkable. IMPRESSION: Normal chest Electronically Signed   By: Zetta Bills M.D.   On: 09/29/2019 19:20    Procedures Procedures (including critical care time)  Medications Ordered in ED Medications - No data to display  ED Course  I have reviewed the triage vital signs and the nursing notes.  Pertinent labs & imaging results that were available during my care of the patient were reviewed by me and considered in my medical decision making (see chart for details).  AERIS HERSMAN was evaluated in Emergency Department on 09/29/2019 for the symptoms described in the history of present illness. He/she was evaluated in the context of the global COVID-19 pandemic, which necessitated consideration that the patient  might be at risk for infection with the SARS-CoV-2 virus that causes COVID-19. Institutional protocols and algorithms that pertain to the evaluation of patients at risk for COVID-19 are in a state of rapid change based on information released by regulatory bodies including the CDC and federal and state organizations. These policies and algorithms were followed during the patient's care in the ED.    MDM Rules/Calculators/A&P                      Patient presents to the emergency department with complaints of fever with associated URI symptoms also mentions some pelvic and lower back pain with onset of menses upon arrival to the emergency department.  Patient is nontoxic, resting comfortably, vitals here are without significant abnormality.  She is afebrile.  She has been taking antipyretics today.  Afebrile in the ED, no sinus tenderness, symptoms only for 24 hours, doubt acute bacterial sinusitis.  No signs of AOM, AOE, mastoiditis. Strep test negative.  No signs of RPA/PTA.  No meningismus.  Lungs are clear to auscultation bilaterally, chest x-ray without infiltrate, personally reviewed and interpreted, agree with radiology read, do not suspect community-acquired pneumonia.  Her abdomen does not have any peritoneal signs to suggest acute surgical process.  No adnexal or cervical motion tenderness to raise concern for PID.  Pregnancy test is negative.  Urinalysis with blood consistent with menses.  UA without UTI.  Wet prep with BV, will treat with vaginal suppository per discussion with the patient.  STD test pending.  She does report fever and back pain, however with her other symptoms and no history of IVDU, do not suspect epidural abscess.  No back pain red flags otherwise.  No neuro deficits.  Overall suspect symptoms related to viral process as well as onset of menses.  Had Covid test earlier today which was negative. Will discharge home with supportive care. I discussed results, treatment plan, need  for follow-up, and return precautions with the patient. Provided opportunity for questions, patient confirmed understanding and is in agreement with plan.     Final  Clinical Impression(s) / ED Diagnoses Final diagnoses:  Fever, unspecified fever cause    Rx / DC Orders ED Discharge Orders         Ordered    naproxen (NAPROSYN) 500 MG tablet  2 times daily     09/29/19 2018    methocarbamol (ROBAXIN) 500 MG tablet  Every 8 hours PRN     09/29/19 2018    clindamycin (CLEOCIN) 2 % vaginal cream  Daily at bedtime     09/29/19 2018    fluticasone (FLONASE) 50 MCG/ACT nasal spray  Daily PRN     09/29/19 2018           Cherly Anderson, PA-C 09/29/19 2022    Milagros Loll, MD 10/01/19 1530

## 2019-09-29 NOTE — Telephone Encounter (Signed)
Pt called with low back pain, period is 2.5 weeks late, + drainage and fever. Pt called back and stated she was getting a rapid test done. Wants appt to be seen for other symptoms. Please call.

## 2019-10-01 LAB — GC/CHLAMYDIA PROBE AMP (~~LOC~~) NOT AT ARMC
Chlamydia: NEGATIVE
Neisseria Gonorrhea: NEGATIVE

## 2019-11-04 ENCOUNTER — Emergency Department (HOSPITAL_COMMUNITY)
Admission: EM | Admit: 2019-11-04 | Discharge: 2019-11-04 | Disposition: A | Discharge status: Home / Self Care | Payer: Self-pay | Attending: Emergency Medicine | Admitting: Emergency Medicine

## 2019-11-04 ENCOUNTER — Other Ambulatory Visit: Payer: Self-pay

## 2019-11-04 ENCOUNTER — Encounter (HOSPITAL_COMMUNITY): Payer: Self-pay | Admitting: Emergency Medicine

## 2019-11-04 ENCOUNTER — Emergency Department (HOSPITAL_COMMUNITY): Payer: Self-pay

## 2019-11-04 DIAGNOSIS — Z87891 Personal history of nicotine dependence: Secondary | ICD-10-CM | POA: Insufficient documentation

## 2019-11-04 DIAGNOSIS — R0789 Other chest pain: Secondary | ICD-10-CM

## 2019-11-04 DIAGNOSIS — R0602 Shortness of breath: Secondary | ICD-10-CM | POA: Insufficient documentation

## 2019-11-04 DIAGNOSIS — J45909 Unspecified asthma, uncomplicated: Secondary | ICD-10-CM | POA: Insufficient documentation

## 2019-11-04 DIAGNOSIS — R079 Chest pain, unspecified: Secondary | ICD-10-CM | POA: Insufficient documentation

## 2019-11-04 LAB — CBC
HCT: 39.2 % (ref 36.0–46.0)
Hemoglobin: 13.4 g/dL (ref 12.0–15.0)
MCH: 35.3 pg — ABNORMAL HIGH (ref 26.0–34.0)
MCHC: 34.2 g/dL (ref 30.0–36.0)
MCV: 103.2 fL — ABNORMAL HIGH (ref 80.0–100.0)
Platelets: 238 10*3/uL (ref 150–400)
RBC: 3.8 MIL/uL — ABNORMAL LOW (ref 3.87–5.11)
RDW: 12.1 % (ref 11.5–15.5)
WBC: 6 10*3/uL (ref 4.0–10.5)
nRBC: 0 % (ref 0.0–0.2)

## 2019-11-04 LAB — BASIC METABOLIC PANEL
Anion gap: 10 (ref 5–15)
BUN: 13 mg/dL (ref 6–20)
CO2: 24 mmol/L (ref 22–32)
Calcium: 9.1 mg/dL (ref 8.9–10.3)
Chloride: 103 mmol/L (ref 98–111)
Creatinine, Ser: 0.67 mg/dL (ref 0.44–1.00)
GFR calc Af Amer: >60 mL/min (ref >60)
GFR calc non Af Amer: >60 mL/min (ref >60)
Glucose, Bld: 86 mg/dL (ref 70–99)
Potassium: 4.3 mmol/L (ref 3.5–5.1)
Sodium: 137 mmol/L (ref 135–145)

## 2019-11-04 LAB — POCT PREGNANCY, URINE: Preg Test, Ur: NEGATIVE

## 2019-11-04 LAB — TROPONIN I (HIGH SENSITIVITY)
Troponin I (High Sensitivity): <2 ng/L (ref <18)
Troponin I (High Sensitivity): <2 ng/L (ref <18)

## 2019-11-04 NOTE — ED Triage Notes (Signed)
Pt reports chest pain and hypertension x3 days. Pt reports intermittent chest pain and shortness of breath at this time. nad noted.

## 2019-11-04 NOTE — ED Provider Notes (Signed)
[facility] EMERGENCY DEPARTMENT Provider Note  CSN: [id number] Arrival date & time: 11/04/19 1456    History Chief Complaint  Patient presents with  . Chest Pain    HPI  Jacqueline Forbes is a 34 y.o. female with no significant past medical history who presents to the ED for evaluation of chest pain.  She states she has had intermittent chest pain for the last 2 days, midsternal nonradiating.  She reports some mild associated shortness of breath but no cough congestion or fever.  She denies any nausea, vomiting, diaphoresis.  Her symptoms come and go without particular provoking or relieving factors.  Today she had a episode and checked her blood pressure noted to be elevated.  She called her primary care physician and was advised to come to the emergency department.  She has no history of hypertension does not take any medications for hypertension, diabetes, hyperlipidemia.  She no longer smokes.  No alcohol or drug use.  She does report some aching pain in her left arm does not seem to be related to the chest pain but no weakness or numbness.   Past Medical History:  Diagnosis Date  . Asthma   . Mental disorder    depression  . Trichomonas infection   . Vaginal Pap smear, abnormal    2013 LSIL    Past Surgical History:  Procedure Laterality Date  . COLPOSCOPY  11/2011  . OOPHORECTOMY      Family History  Problem Relation Age of Onset  . Arthritis Father   . Depression Father   . Arthritis Mother   . Cancer Brother     Social History   Tobacco Use  . Smoking status: Former Smoker    Packs/day: 0.50    Years: 10.00    Pack years: 5.00    Types: E-cigarettes  . Smokeless tobacco: Never Used  Substance Use Topics  . Alcohol use: No  . Drug use: No     Home Medications Prior to Admission medications   Medication Sig Start Date End Date Taking? Authorizing Provider  loratadine (CLARITIN) 10 MG tablet Take 10 mg by mouth daily.   Yes [provider]   Multiple Vitamins-Minerals (MULTIVITAMIN WITH MINERALS) tablet Take 1 tablet by mouth daily.   Yes [provider]  clindamycin (CLEOCIN) 2 % vaginal cream Place 1 Applicatorful vaginally at bedtime. Patient not taking: Reported on 11/04/2019 09/29/19   Petrucelli, Lelon Mast R, PA-C  fluticasone (FLONASE) 50 MCG/ACT nasal spray Place 1 spray into both nostrils daily as needed for allergies or rhinitis. Patient not taking: Reported on 11/04/2019 09/29/19   Petrucelli, Pleas Koch, PA-C  methocarbamol (ROBAXIN) 500 MG tablet Take 1 tablet (500 mg total) by mouth every 8 (eight) hours as needed for muscle spasms. Patient not taking: Reported on 11/04/2019 09/29/19   Petrucelli, Lelon Mast R, PA-C  metroNIDAZOLE (METROGEL VAGINAL) 0.75 % vaginal gel Place 1 Applicatorful vaginally at bedtime. FOR 5 NIGHTS Patient not taking: Reported on 11/04/2019 03/04/19   Cresenzo-Dishmon, Scarlette Calico, CNM  naproxen (NAPROSYN) 500 MG tablet Take 1 tablet (500 mg total) by mouth 2 (two) times daily. Patient not taking: Reported on 11/04/2019 09/29/19   Petrucelli, Pleas Koch, PA-C  naproxen sodium (ANAPROX DS) 550 MG tablet Take 1 tablet (550 mg total) by mouth 2 (two) times daily with a meal. Patient not taking: Reported on 11/04/2019 01/27/17   Lazaro Arms, MD     Allergies    Metronidazole, Septra [sulfamethoxazole-trimethoprim], Sulfa antibiotics, and  Tylenol with codeine #3 [acetaminophen-codeine]   Review of Systems   Review of Systems  Constitutional: Negative for fever.  HENT: Negative for congestion and sore throat.   Respiratory: Positive for shortness of breath. Negative for cough.   Cardiovascular: Positive for chest pain.  Gastrointestinal: Negative for abdominal pain, diarrhea, nausea and vomiting.  Genitourinary: Negative for dysuria.  Musculoskeletal: Positive for myalgias.  Skin: Negative for rash.  Neurological: Negative for headaches.  Psychiatric/Behavioral: Negative for behavioral problems.      Physical Exam BP 116/75 (BP Location: Right Arm)   Pulse 77   Temp 98.2 F (36.8 C) (Oral)   Resp 18   Ht 5\' 4"  (1.626 m)   Wt 70.3 kg   LMP 10/28/2019   SpO2 98%   BMI 26.61 kg/m   Physical Exam Vitals and nursing note reviewed.  Constitutional:      Appearance: Normal appearance.  HENT:     Head: Normocephalic and atraumatic.     Nose: Nose normal.     Mouth/Throat:     Mouth: Mucous membranes are moist.  Eyes:     Extraocular Movements: Extraocular movements intact.     Conjunctiva/sclera: Conjunctivae normal.  Cardiovascular:     Rate and Rhythm: Normal rate.  Pulmonary:     Effort: Pulmonary effort is normal.     Breath sounds: Normal breath sounds.  Chest:     Chest wall: No tenderness.  Abdominal:     General: Abdomen is flat.     Palpations: Abdomen is soft.     Tenderness: There is no abdominal tenderness.  Musculoskeletal:        General: No swelling. Normal range of motion.     Cervical back: Neck supple.  Skin:    General: Skin is warm and dry.  Neurological:     General: No focal deficit present.     Mental Status: She is alert.  Psychiatric:        Mood and Affect: Mood normal.      ED Results / Procedures / Treatments   Labs (all labs ordered are listed, but only abnormal results are displayed) Labs Reviewed  CBC - Abnormal; Notable for the following components:      Result Value   RBC 3.80 (*)    MCV 103.2 (*)    MCH 35.3 (*)    All other components within normal limits  BASIC METABOLIC PANEL  POC URINE PREG, ED  POCT PREGNANCY, URINE  TROPONIN I (HIGH SENSITIVITY)  TROPONIN I (HIGH SENSITIVITY)    EKG EKG Interpretation  Date/Time:  Thursday November 04 2019 15:17:23 EDT Ventricular Rate:  74 PR Interval:  152 QRS Duration: 72 QT Interval:  366 QTC Calculation: 406 R Axis:   58 Text Interpretation: Normal sinus rhythm Normal ECG No significant change since last tracing Confirmed by Calvert Cantor [phone]) on 11/04/2019  3:24:53 PM   Radiology DG Chest 2 View  Result Date: 11/04/2019 CLINICAL DATA:  Chest pain and hypertension for several days EXAM: CHEST - 2 VIEW COMPARISON:  09/29/2018 FINDINGS: The heart size and mediastinal contours are within normal limits. Both lungs are clear. The visualized skeletal structures are unremarkable. IMPRESSION: No active cardiopulmonary disease. Electronically Signed   By: Inez Catalina M.D.   On: 11/04/2019 16:01    Procedures Procedures  Medications Ordered in the ED Medications - No data to display   ED Course  I have reviewed the triage vital signs and the nursing notes.  Pertinent labs &  imaging results that were available during my care of the patient were reviewed by me and considered in my medical decision making (see chart for details).  Clinical Course as of Nov 03 2008  Thu Nov 04, 2019  1933 Patient's labs are unremarkable,including CBC, BMP and Trop x 2. BP has not been elevated in the ED today. Low risk for ACS, recommend PCP followup.   [CS]    Clinical Course User Index [CS] Pollyann Savoy, MD    MDM Rules/Calculators/A&P MDM Number of Diagnoses or Management Options Diagnosis management comments: Patient with atypical chest pain and low risk factor profile.  Her EKG was normal and her for troponin is negative.  Her CBC, CMP and chest x-ray are also normal.  Blood pressure in the ED is not elevated.  Discussed with the patient that the ED work-up is focused on ruling out emergencies and that absent any concerning findings she will need to continue with outpatient work-up for her symptoms.  Her second troponin is pending.    Amount and/or Complexity of Data Reviewed Clinical lab tests: ordered and reviewed Tests in the radiology section of CPT: ordered and reviewed Review and summarize past medical records: yes Independent visualization of images, tracings, or specimens: yes  Risk of Complications, Morbidity, and/or Mortality  Presenting problems: high Diagnostic procedures: high Management options: high    Final Clinical Impression(s) / ED Diagnoses Final diagnoses:  Atypical chest pain    Rx / DC Orders ED Discharge Orders    None       Pollyann Savoy, MD 11/04/19 2010

## 2019-11-04 NOTE — ED Notes (Signed)
Initial EKG had limb lead reversal on interpretation. repeated EKG per protocol. Attempting to delete initial EKG on portable machine. Charge RN aware.

## 2020-01-17 ENCOUNTER — Telehealth: Payer: Self-pay | Admitting: Advanced Practice Midwife

## 2020-01-17 NOTE — Telephone Encounter (Signed)
Pt wants to know what depression/anxiety med that was suggested at her last visit. Patient would like to have this medicine sent to her pharmacy.

## 2020-01-20 ENCOUNTER — Other Ambulatory Visit (INDEPENDENT_AMBULATORY_CARE_PROVIDER_SITE_OTHER): Payer: Self-pay | Admitting: *Deleted

## 2020-01-20 ENCOUNTER — Other Ambulatory Visit: Payer: Self-pay

## 2020-01-20 ENCOUNTER — Encounter: Payer: Self-pay | Admitting: *Deleted

## 2020-01-20 ENCOUNTER — Telehealth: Payer: Self-pay | Admitting: *Deleted

## 2020-01-20 ENCOUNTER — Other Ambulatory Visit: Payer: Self-pay | Admitting: Advanced Practice Midwife

## 2020-01-20 DIAGNOSIS — N898 Other specified noninflammatory disorders of vagina: Secondary | ICD-10-CM

## 2020-01-20 NOTE — Progress Notes (Signed)
   NURSE VISIT- VAGINITIS/STD  SUBJECTIVE:  Jacqueline Forbes is a 34 y.o. G0P0000 GYN patientfemale here for a vaginal swab for STD screen.  She reports the following symptoms: discharge described as clear and curd-like and whitish for 3-4 weeks. Denies abnormal vaginal bleeding, significant pelvic pain, fever, or UTI symptoms.  OBJECTIVE:  LMP 01/06/2020   Appears well, in no apparent distress  ASSESSMENT: Vaginal swab for STD screen  PLAN: Self-collected vaginal probe for Gonorrhea, Chlamydia, Trichomonas, Bacterial Vaginosis, Yeast sent to lab Treatment: to be determined once results are received Follow-up as needed if symptoms persist/worsen, or new symptoms develop  Malachy Mood  01/20/2020 11:35 AM

## 2020-01-20 NOTE — Telephone Encounter (Signed)
Pt spoke to you regarding a depression med that you had taken personally and you done good with that med. Pt can't remember the name of the med that was discussed.

## 2020-01-20 NOTE — Progress Notes (Signed)
Question answered. 

## 2020-01-20 NOTE — Progress Notes (Signed)
Chart reviewed for nurse visit. Agree with plan of care.  Adline Potter, NP 01/20/2020 12:38 PM

## 2020-01-22 LAB — NUSWAB VAGINITIS PLUS (VG+)
Candida albicans, NAA: NEGATIVE
Candida glabrata, NAA: NEGATIVE
Chlamydia trachomatis, NAA: NEGATIVE
Neisseria gonorrhoeae, NAA: NEGATIVE
Trich vag by NAA: NEGATIVE

## 2020-01-27 ENCOUNTER — Telehealth: Payer: Self-pay | Admitting: *Deleted

## 2020-01-27 MED ORDER — FLUCONAZOLE 150 MG PO TABS: ORAL_TABLET | ORAL | 2 refills | Status: AC

## 2020-01-27 NOTE — Telephone Encounter (Signed)
Pt left message that Lexapro was the medicine we had talked about

## 2020-01-27 NOTE — Telephone Encounter (Signed)
Pt is requesting Diflucan be sent to pharmacy due to itching and discharge. Please advise. Thanks!! JSY

## 2020-01-27 NOTE — Telephone Encounter (Signed)
I didn't know that she wanted it--I told her that if she was asking for herself then she would need to make a video visit appointment to see me (maybe she was asking for a friend??)  Drenda Freeze

## 2020-01-27 NOTE — Telephone Encounter (Signed)
Pt has been on Lexapro in the past and had sexual issues. Pt also is having a vaginal discharge. Pt wants an appt scheduled. Tish scheduled an appt for pt. JSY

## 2020-04-12 ENCOUNTER — Ambulatory Visit: Payer: Self-pay | Admitting: Orthopaedic Surgery

## 2020-04-13 ENCOUNTER — Ambulatory Visit: Payer: Self-pay | Admitting: Orthopaedic Surgery

## 2021-10-16 IMAGING — DX DG CHEST 2V
2 series · 2 of 2 positions shown · non-contrast
Comparison: 09/29/2018

CLINICAL DATA: Chest pain and hypertension for several days

EXAM:
CHEST - 2 VIEW

[chest pa]
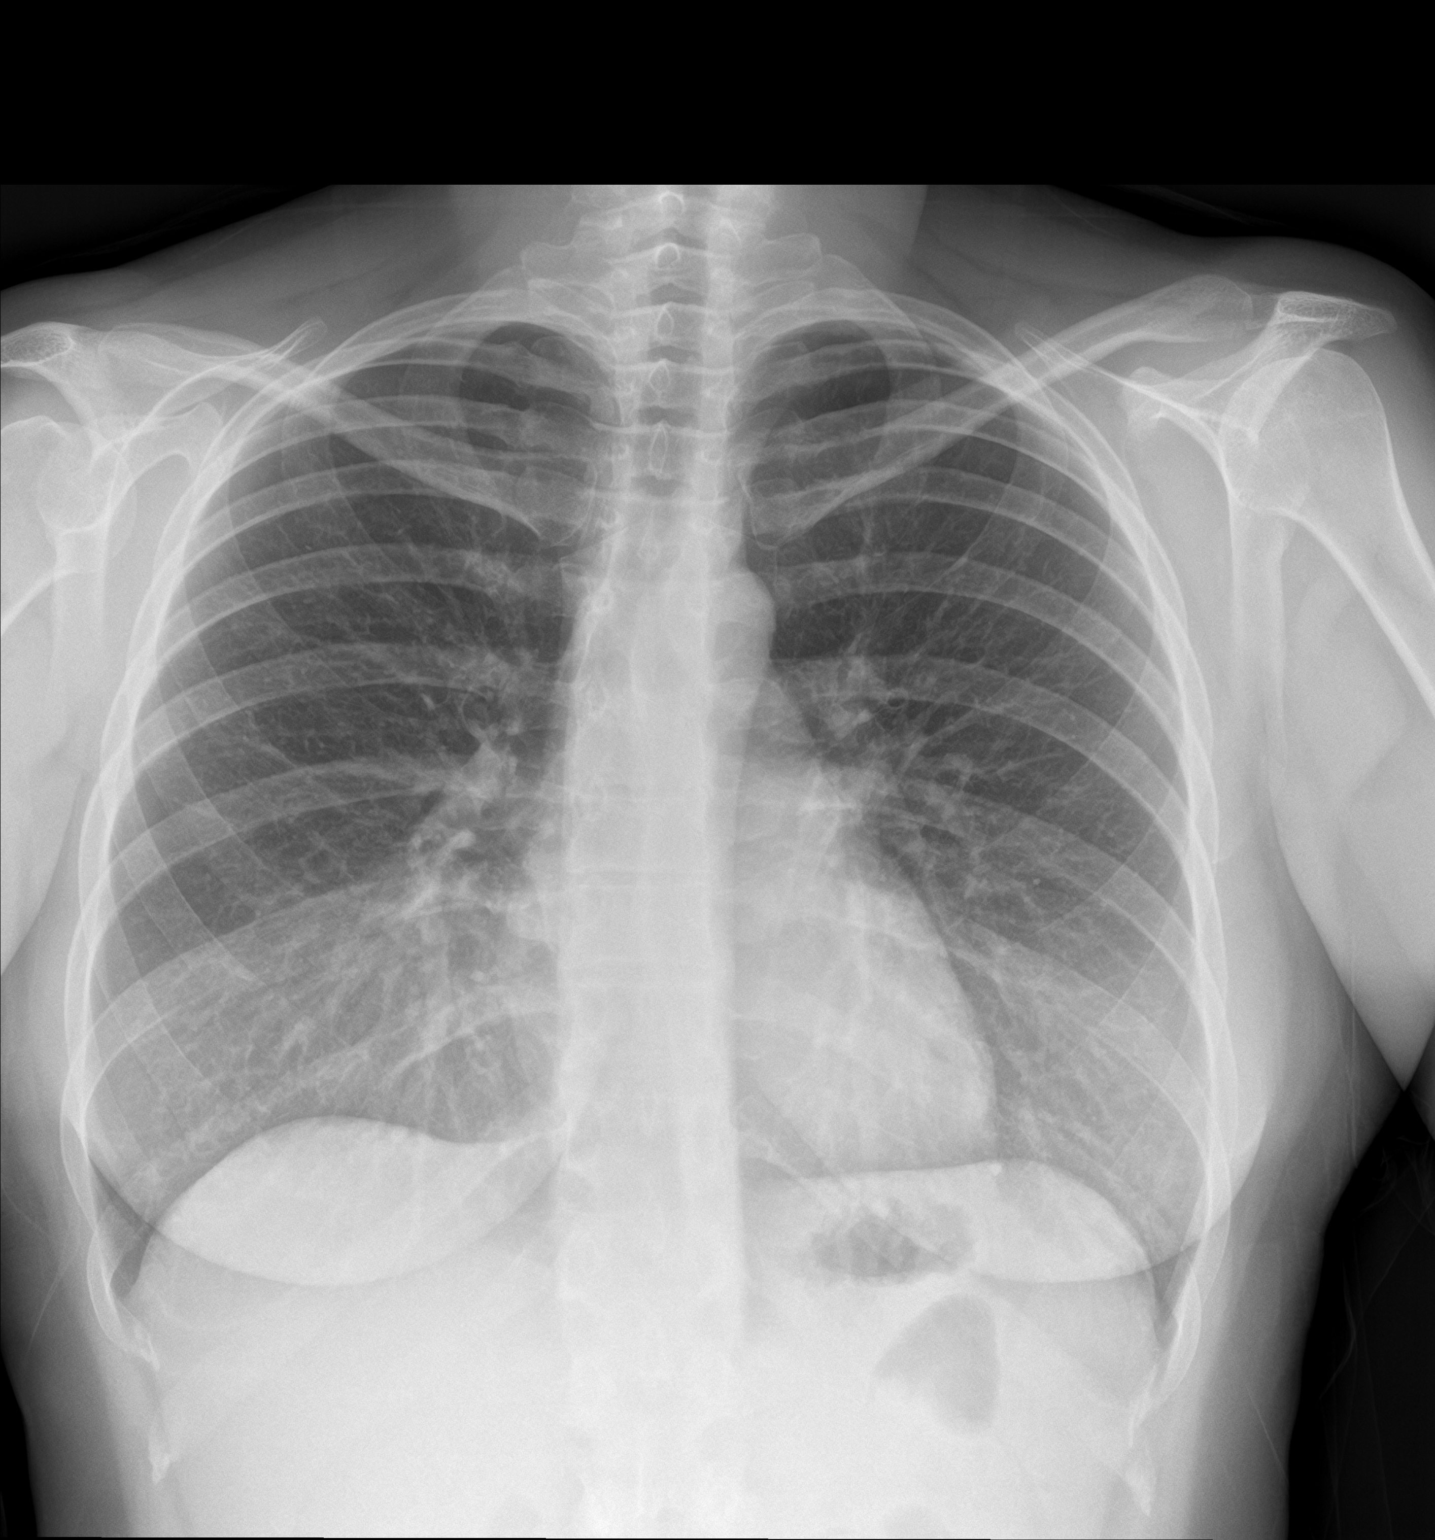

[chest lat]
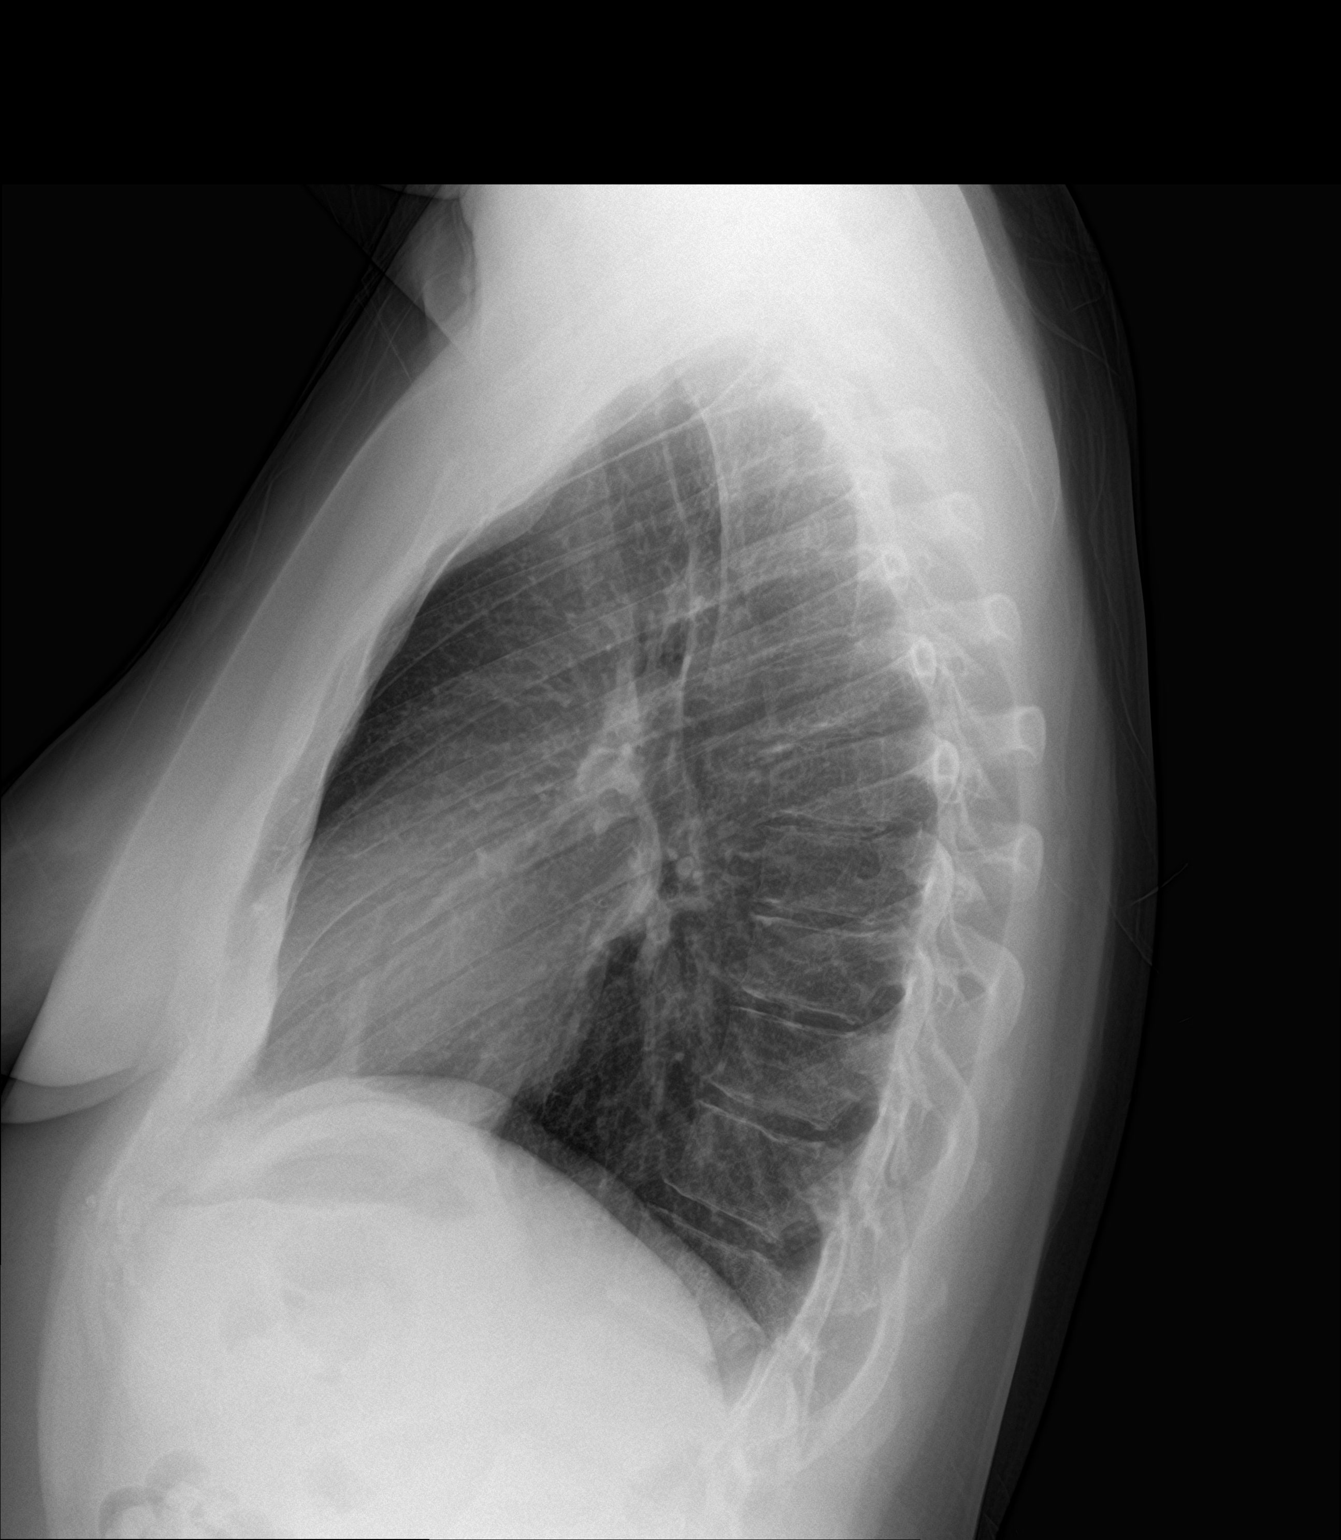

[2 of 2 positions shown; findings below may reference images not displayed]

FINDINGS: The heart size and mediastinal contours are within normal limits.
Both lungs are clear. The visualized skeletal structures are
unremarkable.
IMPRESSION: No active cardiopulmonary disease.

## 2022-04-03 ENCOUNTER — Other Ambulatory Visit (INDEPENDENT_AMBULATORY_CARE_PROVIDER_SITE_OTHER): Payer: Self-pay | Admitting: *Deleted

## 2022-04-03 ENCOUNTER — Other Ambulatory Visit (HOSPITAL_COMMUNITY)
Admission: RE | Admit: 2022-04-03 | Discharge: 2022-04-03 | Disposition: A | Discharge status: Home / Self Care | Payer: Self-pay | Source: Ambulatory Visit | Attending: Obstetrics & Gynecology | Admitting: Obstetrics & Gynecology

## 2022-04-03 DIAGNOSIS — Z113 Encounter for screening for infections with a predominantly sexual mode of transmission: Secondary | ICD-10-CM

## 2022-04-03 DIAGNOSIS — N898 Other specified noninflammatory disorders of vagina: Secondary | ICD-10-CM

## 2022-04-03 NOTE — Progress Notes (Signed)
   NURSE VISIT- VAGINITIS/STD/POC  SUBJECTIVE:  Jacqueline Forbes is a 36 y.o. G0P0000 GYN patientfemale here for a vaginal swab for STD screen.  She reports the following symptoms: none for 0 days. Denies abnormal vaginal bleeding, significant pelvic pain, fever, or UTI symptoms.  OBJECTIVE:  There were no vitals taken for this visit.  Appears well, in no apparent distress  ASSESSMENT: Vaginal swab for STD screen  PLAN: Self-collected vaginal probe for Gonorrhea, Chlamydia, Trichomonas, Bacterial Vaginosis, Yeast sent to lab Treatment: to be determined once results are received Follow-up as needed if symptoms persist/worsen, or new symptoms develop  Annamarie Dawley  04/03/2022 3:01 PM

## 2022-04-05 ENCOUNTER — Other Ambulatory Visit: Payer: Self-pay | Admitting: Obstetrics & Gynecology

## 2022-04-05 DIAGNOSIS — B9689 Other specified bacterial agents as the cause of diseases classified elsewhere: Secondary | ICD-10-CM

## 2022-04-05 LAB — CERVICOVAGINAL ANCILLARY ONLY
Bacterial Vaginitis (gardnerella): POSITIVE — AB
Candida Glabrata: NEGATIVE
Candida Vaginitis: NEGATIVE
Chlamydia: NEGATIVE
Comment: NEGATIVE
Comment: NEGATIVE
Comment: NEGATIVE
Comment: NEGATIVE
Comment: NEGATIVE
Comment: NORMAL
Neisseria Gonorrhea: NEGATIVE
Trichomonas: NEGATIVE

## 2022-04-05 MED ORDER — METRONIDAZOLE 0.75 % EX GEL: 1.0000 | Freq: Every evening | CUTANEOUS | 0 refills | Status: AC

## 2022-04-05 NOTE — Progress Notes (Signed)
Rx for vaginal metronidazole- called pt has problems with oral pills not vaginal treatment  Myna Hidalgo, DO Attending Obstetrician & Gynecologist, [facility] for [employer], [facility]

## 2022-05-09 ENCOUNTER — Ambulatory Visit: Payer: Self-pay | Admitting: Obstetrics & Gynecology

## 2022-05-16 DIAGNOSIS — Z0289 Encounter for other administrative examinations: Secondary | ICD-10-CM

## 2022-06-20 ENCOUNTER — Other Ambulatory Visit: Payer: Self-pay

## 2022-06-20 DIAGNOSIS — B9689 Other specified bacterial agents as the cause of diseases classified elsewhere: Secondary | ICD-10-CM

## 2023-08-28 ENCOUNTER — Other Ambulatory Visit (HOSPITAL_COMMUNITY): Payer: Self-pay | Admitting: Internal Medicine

## 2023-08-28 ENCOUNTER — Ambulatory Visit (HOSPITAL_COMMUNITY)
Admission: RE | Admit: 2023-08-28 | Discharge: 2023-08-28 | Disposition: A | Discharge status: Home / Self Care | Payer: Managed Care, Other (non HMO) | Source: Ambulatory Visit | Attending: Internal Medicine | Admitting: Internal Medicine

## 2023-08-28 DIAGNOSIS — M545 Low back pain, unspecified: Secondary | ICD-10-CM | POA: Insufficient documentation

## 2023-09-26 ENCOUNTER — Other Ambulatory Visit (HOSPITAL_COMMUNITY): Payer: Self-pay | Admitting: Internal Medicine

## 2023-09-26 DIAGNOSIS — M5432 Sciatica, left side: Secondary | ICD-10-CM

## 2023-10-03 ENCOUNTER — Ambulatory Visit (HOSPITAL_COMMUNITY)
Admission: RE | Admit: 2023-10-03 | Discharge: 2023-10-03 | Disposition: A | Discharge status: Home / Self Care | Source: Ambulatory Visit | Attending: Internal Medicine | Admitting: Internal Medicine

## 2023-10-03 ENCOUNTER — Encounter (HOSPITAL_COMMUNITY): Payer: Self-pay

## 2023-10-03 DIAGNOSIS — M5432 Sciatica, left side: Secondary | ICD-10-CM | POA: Diagnosis present

## 2024-08-16 ENCOUNTER — Encounter (HOSPITAL_COMMUNITY): Payer: Self-pay

## 2024-08-16 ENCOUNTER — Other Ambulatory Visit: Payer: Self-pay

## 2024-08-16 ENCOUNTER — Emergency Department (HOSPITAL_COMMUNITY)
Admission: EM | Admit: 2024-08-16 | Discharge: 2024-08-16 | Disposition: A | Discharge status: Home / Self Care | Attending: Emergency Medicine | Admitting: Emergency Medicine

## 2024-08-16 ENCOUNTER — Emergency Department (HOSPITAL_COMMUNITY)

## 2024-08-16 DIAGNOSIS — R300 Dysuria: Secondary | ICD-10-CM | POA: Diagnosis present

## 2024-08-16 DIAGNOSIS — N390 Urinary tract infection, site not specified: Secondary | ICD-10-CM | POA: Insufficient documentation

## 2024-08-16 DIAGNOSIS — J45909 Unspecified asthma, uncomplicated: Secondary | ICD-10-CM | POA: Insufficient documentation

## 2024-08-16 DIAGNOSIS — R197 Diarrhea, unspecified: Secondary | ICD-10-CM | POA: Insufficient documentation

## 2024-08-16 LAB — URINALYSIS, ROUTINE W REFLEX MICROSCOPIC
Bilirubin Urine: NEGATIVE
Glucose, UA: NEGATIVE mg/dL
Ketones, ur: NEGATIVE mg/dL
Leukocytes,Ua: NEGATIVE
Nitrite: NEGATIVE
Protein, ur: NEGATIVE mg/dL
Specific Gravity, Urine: 1.006 (ref 1.005–1.030)
pH: 6 (ref 5.0–8.0)

## 2024-08-16 LAB — BASIC METABOLIC PANEL WITH GFR
Anion gap: 13 (ref 5–15)
BUN: 15 mg/dL (ref 6–20)
CO2: 20 mmol/L — ABNORMAL LOW (ref 22–32)
Calcium: 9 mg/dL (ref 8.9–10.3)
Chloride: 108 mmol/L (ref 98–111)
Creatinine, Ser: 0.84 mg/dL (ref 0.44–1.00)
GFR, Estimated: >60 mL/min
Glucose, Bld: 89 mg/dL (ref 70–99)
Potassium: 3.8 mmol/L (ref 3.5–5.1)
Sodium: 140 mmol/L (ref 135–145)

## 2024-08-16 LAB — HEPATIC FUNCTION PANEL
ALT: 9 U/L (ref 0–44)
AST: 15 U/L (ref 15–41)
Albumin: 4.2 g/dL (ref 3.5–5.0)
Alkaline Phosphatase: 69 U/L (ref 38–126)
Bilirubin, Direct: 0.1 mg/dL (ref 0.0–0.2)
Indirect Bilirubin: 0.2 mg/dL — ABNORMAL LOW (ref 0.3–0.9)
Total Bilirubin: 0.3 mg/dL (ref 0.0–1.2)
Total Protein: 7.1 g/dL (ref 6.5–8.1)

## 2024-08-16 LAB — CBC
HCT: 36.5 % (ref 36.0–46.0)
Hemoglobin: 12.4 g/dL (ref 12.0–15.0)
MCH: 34.1 pg — ABNORMAL HIGH (ref 26.0–34.0)
MCHC: 34 g/dL (ref 30.0–36.0)
MCV: 100.3 fL — ABNORMAL HIGH (ref 80.0–100.0)
Platelets: 239 10*3/uL (ref 150–400)
RBC: 3.64 MIL/uL — ABNORMAL LOW (ref 3.87–5.11)
RDW: 13.4 % (ref 11.5–15.5)
WBC: 6.8 10*3/uL (ref 4.0–10.5)
nRBC: 0 % (ref 0.0–0.2)

## 2024-08-16 LAB — POC URINE PREG, ED: Preg Test, Ur: NEGATIVE

## 2024-08-16 MED ORDER — HYDROCODONE-ACETAMINOPHEN 5-325 MG PO TABS
1.0000 | ORAL_TABLET | Freq: Once | ORAL | Status: AC
  Administered 2024-08-16: 1 via ORAL
  Filled 2024-08-16: qty 1

## 2024-08-16 MED ORDER — ONDANSETRON 8 MG PO TBDP
8.0000 mg | ORAL_TABLET | Freq: Once | ORAL | Status: AC
  Administered 2024-08-16: 8 mg via ORAL
  Filled 2024-08-16: qty 1

## 2024-08-16 MED ORDER — CEPHALEXIN 500 MG PO CAPS
500.0000 mg | ORAL_CAPSULE | Freq: Once | ORAL | Status: AC
  Administered 2024-08-16: 500 mg via ORAL
  Filled 2024-08-16: qty 1

## 2024-08-16 MED ORDER — CEPHALEXIN 500 MG PO CAPS: 500.0000 mg | ORAL_CAPSULE | Freq: Two times a day (BID) | ORAL | 0 refills | Status: AC

## 2024-08-16 NOTE — ED Provider Notes (Signed)
 " [place] [facility] DEPARTMENT AT [facility] Provider Note   CSN: [id number] Arrival date & time: 08/16/24  1153     Patient presents with: Dysuria  HPI Jacqueline Forbes is a 39 y.o. female presenting for dysuria.  Symptoms started this morning.  Endorsing pain with urination and right sided lower back pain.  Also endorsing some intermittent diarrhea but no nausea or vomiting. Denies fever.  Does endorse a strong family history of gallbladder disease.  Past Medical History:  Diagnosis Date   Asthma    Mental disorder    depression   Trichomonas infection    Vaginal Pap smear, abnormal    2013 LSIL       Dysuria      Prior to Admission medications  Medication Sig Start Date End Date Taking? Authorizing Provider  cephALEXin  (KEFLEX ) 500 MG capsule Take 1 capsule (500 mg total) by mouth 2 (two) times daily for 6 days. 08/16/24 08/22/24 Yes Deshonna Trnka K, PA-C  fluconazole  (DIFLUCAN ) 150 MG tablet 1 po stat; repeat in 3 days 01/27/20   Cresenzo-Dishmon, Cathlean, CNM  loratadine  (CLARITIN ) 10 MG tablet Take 10 mg by mouth daily.    [provider]  Multiple Vitamins-Minerals (MULTIVITAMIN WITH MINERALS) tablet Take 1 tablet by mouth daily.    [provider]    Allergies: Metronidazole , Septra [sulfamethoxazole-trimethoprim], Sulfa antibiotics, and Tylenol  with codeine  #3 [acetaminophen -codeine ]    Review of Systems  Genitourinary:  Positive for dysuria.    Updated Vital Signs BP 110/65   Pulse 62   Temp 97.8 F (36.6 C)   Resp 18   Wt 77.1 kg   LMP 08/09/2024 (Approximate)   SpO2 98%   BMI 29.18 kg/m   Physical Exam Vitals and nursing note reviewed.  HENT:     Head: Normocephalic and atraumatic.     Mouth/Throat:     Mouth: Mucous membranes are moist.  Eyes:     General:        Right eye: No discharge.        Left eye: No discharge.     Conjunctiva/sclera: Conjunctivae normal.  Cardiovascular:     Rate and Rhythm: Normal  rate and regular rhythm.     Pulses: Normal pulses.     Heart sounds: Normal heart sounds.  Pulmonary:     Effort: Pulmonary effort is normal.     Breath sounds: Normal breath sounds.  Abdominal:     General: Abdomen is flat.     Palpations: Abdomen is soft.     Tenderness: There is abdominal tenderness in the right upper quadrant. There is right CVA tenderness.  Skin:    General: Skin is warm and dry.  Neurological:     General: No focal deficit present.  Psychiatric:        Mood and Affect: Mood normal.     (all labs ordered are listed, but only abnormal results are displayed) Labs Reviewed  URINALYSIS, ROUTINE W REFLEX MICROSCOPIC - Abnormal; Notable for the following components:      Result Value   Color, Urine STRAW (*)    Hgb urine dipstick MODERATE (*)    Bacteria, UA RARE (*)    All other components within normal limits  BASIC METABOLIC PANEL WITH GFR - Abnormal; Notable for the following components:   CO2 20 (*)    All other components within normal limits  CBC - Abnormal; Notable for the following components:   RBC 3.64 (*)  MCV 100.3 (*)    MCH 34.1 (*)    All other components within normal limits  HEPATIC FUNCTION PANEL - Abnormal; Notable for the following components:   Indirect Bilirubin 0.2 (*)    All other components within normal limits  POC URINE PREG, ED    EKG: None  Radiology: US  Abdomen Limited RUQ (LIVER/GB) Result Date: 08/16/2024 CLINICAL DATA:  [id number] RUQ abdominal pain [id number] EXAM: ULTRASOUND ABDOMEN LIMITED RIGHT UPPER QUADRANT COMPARISON:  CT AP, 08/16/2024. FINDINGS: Gallbladder: Dependent echogenic gallstones within a nondistended gallbladder. No wall thickening visualized. No sonographic Murphy sign noted by sonographer. Common bile duct: Diameter: 0.5 cm Liver: No focal lesion identified. Within normal limits in parenchymal echogenicity. Portal vein is patent on color Doppler imaging with normal direction of blood flow towards the  liver. Other: No perihepatic free fluid IMPRESSION: Cholelithiasis, without sonographic evidence of acute cholecystitis Electronically Signed   By: Thom Hall M.D.   On: 08/16/2024 16:11   CT Renal Stone Study Result Date: 08/16/2024 CLINICAL DATA:  Abdominal/flank pain, stone suspected. Dysuria with right lower back pain. EXAM: CT ABDOMEN AND PELVIS WITHOUT CONTRAST TECHNIQUE: Multidetector CT imaging of the abdomen and pelvis was performed following the standard protocol without IV contrast. RADIATION DOSE REDUCTION: This exam was performed according to the departmental dose-optimization program which includes automated exposure control, adjustment of the mA and/or kV according to patient size and/or use of iterative reconstruction technique. COMPARISON:  None Available. FINDINGS: Lower chest: Lung bases are clear. Hepatobiliary: Cholelithiasis. No gallbladder distension and no inflammatory changes around the gallbladder. Normal appearance of the liver. No biliary dilatation. Pancreas: Unremarkable. No pancreatic ductal dilatation or surrounding inflammatory changes. Spleen: Normal in size without focal abnormality. Adrenals/Urinary Tract: Normal adrenal glands. Normal appearance of both kidneys without stones or hydronephrosis. Normal urinary bladder. Stomach/Bowel: Normal stomach. No gross abnormality to the appendix. No bowel dilatation or obstruction. No focal bowel inflammation. Vascular/Lymphatic: Aortic atherosclerosis. No enlarged abdominal or pelvic lymph nodes. Reproductive: Normal appearance of the uterus. Mild fullness in the right adnexa with low-density area measuring roughly 2.5 cm. This could represent an ovarian cyst and probably within normal limits. Left ovary appears to be absent. Other: Negative for free fluid.  Negative for free air. Musculoskeletal: No acute bone abnormality. IMPRESSION: 1. No acute abnormality in the abdomen or pelvis. Specifically, no evidence for kidney stones or  hydronephrosis. 2. Cholelithiasis without evidence for acute cholecystitis. 3. Mild fullness in the right adnexa with low-density area measuring roughly 2.5 cm. This could represent a right ovarian cyst and probably within normal limits. No follow-up imaging recommended unless there is clinical concern in this area. 4. Aortic Atherosclerosis (ICD10-I70.0). Electronically Signed   By: Juliene Balder M.D.   On: 08/16/2024 15:04     Procedures   Medications Ordered in the ED  cephALEXin  (KEFLEX ) capsule 500 mg (has no administration in time range)  HYDROcodone -acetaminophen  (NORCO/VICODIN) 5-325 MG per tablet 1 tablet (1 tablet Oral Given 08/16/24 1552)  ondansetron  (ZOFRAN -ODT) disintegrating tablet 8 mg (8 mg Oral Given 08/16/24 1553)                                    Medical Decision Making Amount and/or Complexity of Data Reviewed Labs: ordered. Radiology: ordered.  Risk Prescription drug management.   Initial Impression and Ddx 39 yo well presenting for abdominal pain and dysuria.  Exam notable for right upper quadrant  and right CVA tenderness.  DDx includes kidney stone, pyelonephritis, UTI, acute cholecystitis, acute pancreatitis, PE, ectopic pregnancy, ovarian torsion.  Other. Patient PMH that increases complexity of ED encounter:  none  Interpretation of Diagnostics - I independent reviewed and interpreted the labs as followed: Urinalysis: Bacteria hematuria, negative pregnancy  - I independently visualized the following imaging with scope of interpretation limited to determining acute life threatening conditions related to emergency care: CT renal, which revealed cholelithiasis without findings suggestive of acute cholecystitis.  Right ovarian cyst.  Right upper quadrant ultrasound revealed cholelithiasis without acute cholecystitis.  Patient Reassessment and Ultimate Disposition/Management Symptoms improved overall.  Fluid challenge with no issue.  Workup largely reassuring but  revealed gallstone.  Advised her to follow-up with her PCP as she noted that several members of her family have had to have their gallbladders removed.  Also shared finding of right ovarian cyst.  Considered ovarian torsion but feel its unlikely given the right lower quadrant pain.  Given her symptoms of dysuria and questionable urinalysis, we will go ahead and treat for UTI with Keflex .  Discussed return precautions.  Discharged.  Patient management required discussion with the following services or consulting groups:  Hospitalist Service  Complexity of Problems Addressed Acute complicated illness or Injury  Additional Data Reviewed and Analyzed Further history obtained from: Past medical history and medications listed in the EMR and Prior ED visit notes  Patient Encounter Risk Assessment Consideration of hospitalization      Final diagnoses:  Urinary tract infection with hematuria, site unspecified    ED Discharge Orders          Ordered    cephALEXin  (KEFLEX ) 500 MG capsule  2 times daily        08/16/24 1619               Lang Norleen POUR, PA-C 08/16/24 1624  "

## 2024-08-16 NOTE — Discharge Instructions (Addendum)
 Evaluation today is concerning for UTI.  CT and ultrasound also revealed that you have a gallstone but there is no evidence at this time that you have a gallbladder infection.  Would recommend that you follow-up with your PCP.  If you develop worsening abdominal pain, persistent vomiting, develop a fever or any other concerning symptom please return to the ED for further evaluation.

## 2024-08-16 NOTE — ED Triage Notes (Signed)
 Pt reports dysuria since this am with some right side lower back pain.

## 2024-08-16 NOTE — ED Notes (Signed)
 ..  The patient is A&OX4, ambulatory at d/c with independent steady gait, NAD. Pt verbalized understanding of d/c instructions, prescription and follow up care.

## 2024-09-07 ENCOUNTER — Ambulatory Visit: Admitting: General Surgery

## 2024-09-30 ENCOUNTER — Encounter: Admitting: Obstetrics & Gynecology
